# Patient Record
Sex: Female | Born: 1945 | Race: White | Hispanic: No | Marital: Married | Smoking: Never smoker
Health system: Southern US, Community
[De-identification: ages and names within clinical notes are randomized; demographics above are authoritative.]

## PROBLEM LIST (undated history)

## (undated) DIAGNOSIS — K219 Gastro-esophageal reflux disease without esophagitis: Secondary | ICD-10-CM

## (undated) DIAGNOSIS — K6389 Other specified diseases of intestine: Secondary | ICD-10-CM

## (undated) DIAGNOSIS — K635 Polyp of colon: Secondary | ICD-10-CM

## (undated) DIAGNOSIS — K5190 Ulcerative colitis, unspecified, without complications: Secondary | ICD-10-CM

## (undated) DIAGNOSIS — F32A Depression, unspecified: Secondary | ICD-10-CM

## (undated) DIAGNOSIS — D649 Anemia, unspecified: Secondary | ICD-10-CM

## (undated) DIAGNOSIS — K589 Irritable bowel syndrome without diarrhea: Secondary | ICD-10-CM

## (undated) DIAGNOSIS — M810 Age-related osteoporosis without current pathological fracture: Secondary | ICD-10-CM

## (undated) DIAGNOSIS — F419 Anxiety disorder, unspecified: Secondary | ICD-10-CM

## (undated) DIAGNOSIS — K5900 Constipation, unspecified: Secondary | ICD-10-CM

## (undated) DIAGNOSIS — R51 Headache: Secondary | ICD-10-CM

## (undated) DIAGNOSIS — F329 Major depressive disorder, single episode, unspecified: Secondary | ICD-10-CM

## (undated) DIAGNOSIS — R519 Headache, unspecified: Secondary | ICD-10-CM

## (undated) HISTORY — DX: Ulcerative colitis, unspecified, without complications: K51.90

## (undated) HISTORY — PX: OTHER SURGICAL HISTORY: SHX169

## (undated) HISTORY — DX: Other specified diseases of intestine: K63.89

## (undated) HISTORY — PX: TONSILLECTOMY: SUR1361

## (undated) HISTORY — DX: Anemia, unspecified: D64.9

## (undated) HISTORY — PX: ABDOMINAL HYSTERECTOMY: SHX81

## (undated) HISTORY — DX: Gastro-esophageal reflux disease without esophagitis: K21.9

## (undated) HISTORY — DX: Major depressive disorder, single episode, unspecified: F32.9

## (undated) HISTORY — PX: APPENDECTOMY: SHX54

## (undated) HISTORY — DX: Polyp of colon: K63.5

## (undated) HISTORY — PX: HIP ARTHROSCOPY: SUR88

## (undated) HISTORY — DX: Irritable bowel syndrome, unspecified: K58.9

## (undated) HISTORY — DX: Depression, unspecified: F32.A

## (undated) HISTORY — PX: COLONOSCOPY W/ POLYPECTOMY: SHX1380

## (undated) HISTORY — DX: Anxiety disorder, unspecified: F41.9

---

## 1985-11-09 HISTORY — PX: SACROILIAC JOINT FUSION: SHX6088

## 1999-10-22 ENCOUNTER — Encounter: Admission: RE | Admit: 1999-10-22 | Discharge: 2000-01-20 | Payer: Self-pay | Admitting: Anesthesiology

## 2000-02-05 ENCOUNTER — Encounter: Admission: RE | Admit: 2000-02-05 | Discharge: 2000-05-05 | Payer: Self-pay | Admitting: Anesthesiology

## 2000-06-15 ENCOUNTER — Encounter: Admission: RE | Admit: 2000-06-15 | Discharge: 2000-09-13 | Payer: Self-pay | Admitting: Anesthesiology

## 2000-10-25 ENCOUNTER — Encounter: Admission: RE | Admit: 2000-10-25 | Discharge: 2001-01-23 | Payer: Self-pay | Admitting: Anesthesiology

## 2001-06-13 ENCOUNTER — Encounter: Admission: RE | Admit: 2001-06-13 | Discharge: 2001-07-09 | Payer: Self-pay | Admitting: Anesthesiology

## 2004-09-23 ENCOUNTER — Encounter: Admission: RE | Admit: 2004-09-23 | Discharge: 2004-09-23 | Payer: Self-pay | Admitting: Neurology

## 2005-03-06 ENCOUNTER — Encounter
Admission: RE | Admit: 2005-03-06 | Discharge: 2005-06-04 | Payer: Self-pay | Admitting: Physical Medicine & Rehabilitation

## 2005-03-10 ENCOUNTER — Ambulatory Visit: Payer: Self-pay | Admitting: Physical Medicine & Rehabilitation

## 2005-04-22 ENCOUNTER — Ambulatory Visit: Payer: Self-pay | Admitting: Physical Medicine & Rehabilitation

## 2005-05-26 ENCOUNTER — Ambulatory Visit: Payer: Self-pay | Admitting: Physical Medicine & Rehabilitation

## 2005-06-24 ENCOUNTER — Encounter
Admission: RE | Admit: 2005-06-24 | Discharge: 2005-09-22 | Payer: Self-pay | Admitting: Physical Medicine & Rehabilitation

## 2005-07-29 ENCOUNTER — Ambulatory Visit: Payer: Self-pay | Admitting: Physical Medicine & Rehabilitation

## 2005-10-06 ENCOUNTER — Encounter: Admission: RE | Admit: 2005-10-06 | Discharge: 2005-10-06 | Payer: Self-pay | Admitting: Neurosurgery

## 2014-11-09 HISTORY — PX: CHOLECYSTECTOMY: SHX55

## 2015-02-07 ENCOUNTER — Encounter: Payer: Self-pay | Admitting: Internal Medicine

## 2015-04-19 ENCOUNTER — Ambulatory Visit (INDEPENDENT_AMBULATORY_CARE_PROVIDER_SITE_OTHER): Payer: Medicare Other | Admitting: Internal Medicine

## 2015-04-19 ENCOUNTER — Encounter: Payer: Self-pay | Admitting: Internal Medicine

## 2015-04-19 VITALS — BP 158/100 | HR 80 | Ht 60.0 in | Wt 109.0 lb

## 2015-04-19 DIAGNOSIS — R109 Unspecified abdominal pain: Secondary | ICD-10-CM

## 2015-04-19 DIAGNOSIS — K5900 Constipation, unspecified: Secondary | ICD-10-CM | POA: Diagnosis not present

## 2015-04-19 DIAGNOSIS — R197 Diarrhea, unspecified: Secondary | ICD-10-CM

## 2015-04-19 DIAGNOSIS — K219 Gastro-esophageal reflux disease without esophagitis: Secondary | ICD-10-CM | POA: Diagnosis not present

## 2015-04-19 DIAGNOSIS — R935 Abnormal findings on diagnostic imaging of other abdominal regions, including retroperitoneum: Secondary | ICD-10-CM

## 2015-04-19 MED ORDER — NA SULFATE-K SULFATE-MG SULF 17.5-3.13-1.6 GM/177ML PO SOLN: 1.0000 | Freq: Once | ORAL | Status: DC

## 2015-04-19 MED ORDER — DIPHENOXYLATE-ATROPINE 2.5-0.025 MG PO TABS: 1.0000 | ORAL_TABLET | ORAL | Status: DC | PRN

## 2015-04-19 NOTE — Patient Instructions (Signed)
You have been scheduled for a colonoscopy. Please follow written instructions given to you at your visit today.  Please pick up your prep supplies at the pharmacy within the next 1-3 days. If you use inhalers (even only as needed), please bring them with you on the day of your procedure.   

## 2015-04-19 NOTE — Progress Notes (Signed)
HISTORY OF PRESENT ILLNESS:  Sherri Olsen is a 69 y.o. female who is referred by her primary care physician Dr. Salli Olsen regarding "ulcerative colitis". Patient reports a greater than 10 year history of chronic GI complaints manifested by constipation alternating with diarrhea. She takes both laxative's and anti-diarrheal's on demand. She also has chronic lower abdominal pain 10 years. Seemingly improved after defecation. She tells me that she has had a previous gastroenterologist but has no specific diagnosis. The patient is a retired Therapist, sports. She brings no outside medical records. Her current history is that of severe lower abdominal pain associated with nausea and vomiting 01/01/2015. She tells me that she went to the local emergency room and underwent a contrast enhanced CT scan of the abdomen and pelvis and was given a diagnosis of "ulcerative colitis". She was not having unusual diarrhea or bleeding at that time. She was told to see a gastroenterologist. She did not want to see a local specialist, but rather here as she previously worked at [employer] She also has a history of chronic GERD for which she takes PPI and Zantac. Despite this reports breakthrough symptoms. No dysphagia.. She tells me that she last underwent colonoscopy 11 years ago (findings uncertain). Told to have repeat colonoscopy last year. Interested now. Also tells me she underwent upper endoscopy 3 or 4 years ago to evaluate noncardiac chest pain. She tells me that she had Helicobacter pylori but was not treated. Her weight fluctuates. She is anxious and pleeds that she wants her abdominal complaints to resolve. She is status post appendectomy and partial hysterectomy  REVIEW OF SYSTEMS:  All non-GI ROS negative except for anxiety, back pain, fatigue, night sweats, sleeping problems, urinary leakage, excessive urination, depression  Past Medical History  Diagnosis Date  . Anemia   . GERD (gastroesophageal reflux disease)    . Ulcerative colitis   . Anxiety and depression     Past Surgical History  Procedure Laterality Date  . Appendectomy    . Pip joint fusion  1987    Hip  . Herniated disk    . Hip arthroscopy      Social History Sherri Olsen  reports that she has never smoked. She has never used smokeless tobacco. She reports that she does not drink alcohol or use illicit drugs.  family history includes Colon cancer in her father; Diabetes in her maternal uncle; Diverticulitis in her mother; Irritable bowel syndrome in her maternal uncle; Ulcerative colitis in her maternal uncle.  No Known Allergies     PHYSICAL EXAMINATION: Vital signs: BP 154/100 mmHg  Pulse 80  Ht 5' (1.524 m)  Wt 109 lb (49.442 kg)  BMI 21.29 kg/m2  Constitutional: generally well-appearing, no acute distress Psychiatric: alert and oriented x3, cooperative Eyes: extraocular movements intact, anicteric, conjunctiva pink Mouth: oral pharynx moist, no lesions Neck: supple no lymphadenopathy Cardiovascular: heart regular rate and rhythm, no murmur Lungs: clear to auscultation bilaterally Abdomen: soft, nontender, nondistended, no obvious ascites, no peritoneal signs, normal bowel sounds, no organomegaly Rectal: Deferred until colonoscopy Extremities: no lower extremity edema bilaterally Skin: no lesions on visible extremities Neuro: No focal deficits. No asterixis.    ASSESSMENT:  #1. Probable irritable bowel syndrome #2. Not certain what CT scan finding was elsewhere in February. Not certain to me that she has ulcerative colitis, but needs to be excluded #3. Chronic GERD. Breakthrough symptoms despite therapy #4. Reports history of Helicobacter pylori untreated #5. Gen. medical problems  PLAN:  #1. Refill  Lomotil for diarrhea as requested #2. Schedule colonoscopy to evaluate abnormal CT scan finding, lower GI complaints, and provide follow-up neoplasia screening.The nature of the procedure, as well as  the risks, benefits, and alternatives were carefully and thoroughly reviewed with the patient. Ample time for discussion and questions allowed. The patient understood, was satisfied, and agreed to proceed. #3. Schedule upper endoscopy with duodenal biopsies to evaluate pain, diarrhea, and rule out active Helicobacter pylori infection.The nature of the procedure, as well as the risks, benefits, and alternatives were carefully and thoroughly reviewed with the patient. Ample time for discussion and questions allowed. The patient understood, was satisfied, and agreed to proceed. #4. Release sign for her to retrieve outside records. We will review these. #5. Uncomfortable at her last colonoscopy with conscious sedation. Did not have propofol. We'll use propofol this time with CRNA monitoring   Of this consultation has been sent to Dr. Salli Olsen

## 2015-04-30 ENCOUNTER — Ambulatory Visit (AMBULATORY_SURGERY_CENTER): Payer: Medicare Other | Admitting: Internal Medicine

## 2015-04-30 ENCOUNTER — Encounter: Payer: Self-pay | Admitting: Internal Medicine

## 2015-04-30 VITALS — BP 133/99 | HR 78 | Temp 98.2°F | Resp 28 | Ht 60.0 in | Wt 109.0 lb

## 2015-04-30 DIAGNOSIS — K219 Gastro-esophageal reflux disease without esophagitis: Secondary | ICD-10-CM

## 2015-04-30 DIAGNOSIS — Z1211 Encounter for screening for malignant neoplasm of colon: Secondary | ICD-10-CM | POA: Diagnosis not present

## 2015-04-30 DIAGNOSIS — R1084 Generalized abdominal pain: Secondary | ICD-10-CM

## 2015-04-30 DIAGNOSIS — K5909 Other constipation: Secondary | ICD-10-CM

## 2015-04-30 DIAGNOSIS — Z8619 Personal history of other infectious and parasitic diseases: Secondary | ICD-10-CM | POA: Diagnosis not present

## 2015-04-30 DIAGNOSIS — D122 Benign neoplasm of ascending colon: Secondary | ICD-10-CM | POA: Diagnosis not present

## 2015-04-30 DIAGNOSIS — D120 Benign neoplasm of cecum: Secondary | ICD-10-CM

## 2015-04-30 DIAGNOSIS — K5900 Constipation, unspecified: Secondary | ICD-10-CM | POA: Diagnosis not present

## 2015-04-30 MED ORDER — SODIUM CHLORIDE 0.9 % IV SOLN: 500.0000 mL | INTRAVENOUS | Status: DC

## 2015-04-30 NOTE — Op Note (Signed)
[place]  [address]. [place], [id number]   ENDOSCOPY PROCEDURE REPORT  PATIENT: Sherri, Olsen  MR#: [id number] BIRTHDATE: October 14, 1946 , 39  yrs. old GENDER: female ENDOSCOPIST: Eustace Quail, MD REFERRED BY:  Elyn Aquas, M.D. PROCEDURE DATE:  04/30/2015 PROCEDURE:  EGD w/ biopsy ASA CLASS:     Class II INDICATIONS:  dyspepsia and history of esophageal reflux. . Reports Helicobacter pylori untreated MEDICATIONS: Monitored anesthesia care and Propofol 50 mg IV TOPICAL ANESTHETIC: none  DESCRIPTION OF PROCEDURE: After the risks benefits and alternatives of the procedure were thoroughly explained, informed consent was obtained.  The LB [id number] endoscope was introduced through the mouth and advanced to the second portion of the duodenum , Without limitations.  The instrument was slowly withdrawn as the mucosa was fully examined.   EXAM: The esophagus and gastroesophageal junction were completely normal in appearance.  The stomach was entered and closely examined.The antrum, angularis, and lesser curvature were well visualized, including a retroflexed view of the cardia and fundus. The stomach wall was normally distensable.  The scope passed easily through the pylorus into the duodenum.  Retroflexed views revealed no abnormalities.Marland Kitchen CLO biopsy from the gastric antrum taken. The scope was then withdrawn from the patient and the procedure completed.  COMPLICATIONS: There were no immediate complications.  ENDOSCOPIC IMPRESSION: 1. Normal EGD 2. CLO biopsy taken  RECOMMENDATIONS: 1.  Anti-reflux regimen to be followed 2.  Continue current meds 3.  Rx CLO if positive. This test is for Helicobacter pylori. We'll call you if positive only. 4. Make a routine follow-up appointment with Dr. Henrene Pastor in 6-8 weeks   REPEAT EXAM:  eSigned:  Eustace Quail, MD 04/30/2015 2:48 PM    CC:The Patient and Elyn Aquas, MD

## 2015-04-30 NOTE — Op Note (Signed)
[place]  [address]. [place], [id number]   COLONOSCOPY PROCEDURE REPORT  PATIENT: Sherri Olsen, Sherri Olsen  MR#: [id number] BIRTHDATE: [birth year] , 52  yrs. old GENDER: female ENDOSCOPIST: Eustace Quail, MD REFERRED EL:MRAJHHI Salli Real, M.D. PROCEDURE DATE:  04/30/2015 PROCEDURE:   Colonoscopy, screening and Colonoscopy with snare polypectomy x 2 First Screening Colonoscopy - Avg.  risk and is 50 yrs.  old or older Yes.  Prior Negative Screening - Now for repeat screening. N/A  History of Adenoma - Now for follow-up colonoscopy & has been > or = to 3 yrs.  N/A  Polyps removed today? Yes ASA CLASS:   Class II INDICATIONS:Screening for colonic neoplasia and Colorectal Neoplasm Risk Assessment for this procedure is average risk. Reports colonoscopy elsewhere 11 years ago. No report available for review. No polyps per patient report. MEDICATIONS: Monitored anesthesia care and Propofol 250 mg IV DESCRIPTION OF PROCEDURE:   After the risks benefits and alternatives of the procedure were thoroughly explained, informed consent was obtained.  The digital rectal exam revealed no abnormalities of the rectum.   The LB [id number]  endoscope was introduced through the anus and advanced to the cecum, which was identified by both the appendix and ileocecal valve. No adverse events experienced.   The quality of the prep was excellent. (Suprep was used)  The instrument was then slowly withdrawn as the colon was fully examined. Estimated blood loss is zero unless otherwise noted in this procedure report.  COLON FINDINGS: A pedunculated polyp measuring 12 mm in size was found at the cecum.  A polypectomy was performed using snare cautery.   A sessile polyp measuring 7 mm in size was found in the ascending colon.  A polypectomy was performed with a cold snare. The resection for each was complete, the polyp tissue was completely retrieved and sent to histology.   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   The examination was otherwise normal. Retroflexed views revealed internal hemorrhoids. The time to cecum = 2.2 Withdrawal time = 10.8   The scope was withdrawn and the procedure completed. COMPLICATIONS: There were no immediate complications.  ENDOSCOPIC IMPRESSION: 1.   Pedunculated polyp was found at the cecum; polypectomy was performed using snare cautery 2.   Sessile polyp was found in the ascending colon; polypectomy was performed with a cold snare 3.   Melanosis coli was found throughout the entire examined colon 4.   The examination was otherwise normal 5. Chronic GI complaints consistent with irritable bowel syndrome  RECOMMENDATIONS: 1.  Repeat Colonoscopy in 3 years. 2.  Upper endoscopy today (please see report)  eSigned:  Eustace Quail, MD 04/30/2015 2:43 PM   cc: The Patient    ; Elyn Aquas, MD

## 2015-04-30 NOTE — Progress Notes (Signed)
Called to room to assist during endoscopic procedure.  Patient ID and intended procedure confirmed with present staff. Received instructions for my participation in the procedure from the performing physician.  

## 2015-04-30 NOTE — Patient Instructions (Signed)

## 2015-04-30 NOTE — Progress Notes (Signed)
Report to PACU, RN, vss, BBS= Clear.  

## 2015-05-01 ENCOUNTER — Telehealth: Payer: Self-pay

## 2015-05-01 LAB — HELICOBACTER PYLORI SCREEN-BIOPSY: UREASE: NEGATIVE

## 2015-05-01 NOTE — Telephone Encounter (Signed)
Left a message at [phone] for the pt with ID on the recording.  Pt to call back if any questions or concerns. maw

## 2015-05-06 ENCOUNTER — Encounter: Payer: Self-pay | Admitting: Internal Medicine

## 2015-05-09 ENCOUNTER — Telehealth: Payer: Self-pay | Admitting: Internal Medicine

## 2015-05-09 MED ORDER — DIPHENOXYLATE-ATROPINE 2.5-0.025 MG PO TABS: 1.0000 | ORAL_TABLET | ORAL | Status: DC | PRN

## 2015-05-10 ENCOUNTER — Other Ambulatory Visit: Payer: Self-pay | Admitting: Internal Medicine

## 2015-05-10 NOTE — Telephone Encounter (Signed)
Refilled Lomotil 

## 2015-05-14 NOTE — Telephone Encounter (Signed)
Faxed Lomotil rx

## 2015-05-27 ENCOUNTER — Other Ambulatory Visit: Payer: Self-pay | Admitting: Internal Medicine

## 2015-05-27 ENCOUNTER — Telehealth: Payer: Self-pay | Admitting: Internal Medicine

## 2015-05-28 NOTE — Telephone Encounter (Signed)
Refilled Lomotil 

## 2015-06-06 ENCOUNTER — Telehealth: Payer: Self-pay | Admitting: Internal Medicine

## 2015-06-06 MED ORDER — DIPHENOXYLATE-ATROPINE 2.5-0.025 MG PO TABS: ORAL_TABLET | ORAL | Status: DC

## 2015-06-06 NOTE — Telephone Encounter (Signed)
Faxed rx for Lomotil to [facility] (2nd time)

## 2015-06-12 ENCOUNTER — Encounter: Payer: Self-pay | Admitting: Internal Medicine

## 2015-06-12 ENCOUNTER — Ambulatory Visit (INDEPENDENT_AMBULATORY_CARE_PROVIDER_SITE_OTHER): Payer: Medicare Other | Admitting: Internal Medicine

## 2015-06-12 VITALS — BP 132/74 | HR 72 | Ht 60.0 in | Wt 114.2 lb

## 2015-06-12 DIAGNOSIS — R197 Diarrhea, unspecified: Secondary | ICD-10-CM | POA: Diagnosis not present

## 2015-06-12 DIAGNOSIS — K589 Irritable bowel syndrome without diarrhea: Secondary | ICD-10-CM

## 2015-06-12 DIAGNOSIS — K219 Gastro-esophageal reflux disease without esophagitis: Secondary | ICD-10-CM | POA: Diagnosis not present

## 2015-06-12 MED ORDER — DIPHENOXYLATE-ATROPINE 2.5-0.025 MG PO TABS: ORAL_TABLET | ORAL | Status: DC

## 2015-06-12 NOTE — Progress Notes (Signed)
HISTORY OF PRESENT ILLNESS:  Sherri Olsen is a 69 y.o. female who was initially evaluated as a new patient 04/19/2015 regarding multiple GI complaints (see dictation). She was felt to have probable irritable bowel syndrome and chronic GERD. There was a dubious diagnosis of "colitis" and reports of untreated Helicobacter pylori. For her diarrhea we've refilled Lomotil. She subsequently underwent colonoscopy and upper endoscopy on 04/30/2015. Colonoscopy revealed large adenomatous colon polyps which were removed and melanosis coli. Repeat colonoscopy in 3 years recommended. Upper endoscopy was performed and returned normal. Biopsies for Helicobacter pylori were negative. She presents for follow-up at this time. The patient reports good control of reflux symptoms on ranitidine 300 mg twice a day. Or omeprazole 40 mg daily. She continues to complain of alternating bowel habits for which she takes laxative use and anti-diarrheal's. No new complaints. She has multiple questions. We reviewed her findings in detail.  REVIEW OF SYSTEMS:  All non-GI ROS negative except for anxiety, arthritis  Past Medical History  Diagnosis Date  . Anemia   . GERD (gastroesophageal reflux disease)   . Ulcerative colitis   . Anxiety and depression     Past Surgical History  Procedure Laterality Date  . Appendectomy    . Pip joint fusion  1987    Hip  . Herniated disk    . Hip arthroscopy      Social History Sherri Olsen  reports that she has never smoked. She has never used smokeless tobacco. She reports that she does not drink alcohol or use illicit drugs.  family history includes Colon cancer in her maternal grandmother; Colon cancer (age of onset: 17) in her father; Diabetes in her maternal uncle; Diverticulitis in her mother; Irritable bowel syndrome in her maternal uncle; Ulcerative colitis in her maternal uncle.  No Known Allergies     PHYSICAL EXAMINATION: Vital signs: BP 132/74 mmHg   Pulse 72  Ht 5' (1.524 m)  Wt 114 lb 3.2 oz (51.801 kg)  BMI 22.30 kg/m2 General: Well-developed, well-nourished, no acute distress HEENT: Sclerae are anicteric, conjunctiva pink. Oral mucosa intact Lungs: Clear Heart: Regular Abdomen: soft, nontender, nondistended, no obvious ascites, no peritoneal signs, normal bowel sounds. No organomegaly. Extremities: No clubbing cyanosis or edema Psychiatric: alert and oriented x3. Cooperative   ASSESSMENT:  #1. Chronic irritable bowel syndrome. Alternating. Ongoing #2. GERD. Controlled with medical therapy #3. Adenomatous colon polyps. Due for surveillance in 3 years  PLAN:  #1. Educational discussion on irritable bowel syndrome. Recommended that she increase fiber in hopes of reducing wide swings in bowel habits with resultant less ping-pong use of anti-diarrheal's and laxative #2. Reflux precautions #3. Refill Lomotil as requested. Minimal use stressed #4. Lowest dose of acid suppressive therapy to control reflux symptoms recommended #5. Surveillance colonoscopy 3 years #6. Routine office follow-up one year  25 minutes was spent face-to-face with this patient. Greater than 50% of the time was used for counseling regarding her GI conditions and answering her multiple questions.

## 2015-06-12 NOTE — Patient Instructions (Signed)
We have sent the following medications to your pharmacy for you to pick up at your convenience:  Lomotil  Please follow up with Dr. Henrene Pastor in one year

## 2015-06-20 ENCOUNTER — Other Ambulatory Visit: Payer: Self-pay | Admitting: Internal Medicine

## 2015-07-01 ENCOUNTER — Other Ambulatory Visit: Payer: Self-pay | Admitting: Internal Medicine

## 2015-07-08 ENCOUNTER — Other Ambulatory Visit: Payer: Self-pay | Admitting: Internal Medicine

## 2015-07-09 NOTE — Telephone Encounter (Signed)
Patient requesting refill of Lomotil.  Was given #30 on 07/01/2015.  Spoke with pharmacist at [facility] in [place] who reported that patient was in a ER in [place] a couple of times and was given several narcotics.  Pharmacist states they are refilling rx's for her early on a regular basis.  Should I refill her Lomotil or wait?  Thanks!

## 2015-07-09 NOTE — Telephone Encounter (Signed)
No refill

## 2015-08-05 ENCOUNTER — Telehealth: Payer: Self-pay | Admitting: Internal Medicine

## 2015-08-07 ENCOUNTER — Other Ambulatory Visit: Payer: Self-pay | Admitting: Internal Medicine

## 2015-08-09 ENCOUNTER — Telehealth: Payer: Self-pay

## 2015-08-09 NOTE — Telephone Encounter (Signed)
Spoke with Jana Half at [facility] in [place] again and she that patient tried to transfer a Lomotil rx from [facility] (Dr. Elyn Aquas) to [facility].  That rx was filled on 07/22/2015 by that pharmacy - she was given #60.  Jana Half spoke with the pharmacy there and they have deactivated that rx but patient is definitely obtaining Lomotil from other sources.  Jana Half is going to call patient.

## 2015-08-09 NOTE — Telephone Encounter (Signed)
Spoke with Sherri Olsen at [facility] - Lomotil last filled on 07/01/2015 with no refills.  Patient requested another refill on 07/08/2015. On 07/09/2015, before I attempted to refill it, pharmacist told me patient was refilling all her medicines early.  Sent this message to Dr. Henrene Pastor who said no refill at that time.  Denied the refill request of #30 with 1 refill on 07/10/2015 with the reason that patient was requesting refill too soon.  Somehow patient was able to refill that rx with 1 refill - either the pharmacist misunderstood that I was refusing the request or someone else called this in and did not document it.  Patient received #30 of Lomotil on 07/09/2015.  She then refilled #30 on 08/03/2015 and again on 08/06/2015. She then called the pharmacist 08/07/2015 asking for more.  Pharmacist called me and I denied the request over the phone.  Pharmacist will document chain of events on her side as well.

## 2015-08-12 ENCOUNTER — Telehealth: Payer: Self-pay | Admitting: Internal Medicine

## 2015-08-12 NOTE — Telephone Encounter (Signed)
Patient called back requesting the dissolvable zofran. Patient states that she is vomiting and having diarrhea.

## 2015-08-12 NOTE — Telephone Encounter (Signed)
Not only was patient somehow to able to refill her Lomotil 2 times - #30 each on 9/24 and 9/27- since I denied her last request on 07/07/2015, she also got #60 from another doctor on 9/12.  Patient continues to call requesting refills and claiming terrible diarrhea; told to try otc options but so far continues to call with same issues;   Please advise!  Thanks!

## 2015-08-12 NOTE — Telephone Encounter (Signed)
Sorry - didn't see yet another phone message from her - requesting Zofran, which I can't find anywhere in her med list.

## 2015-08-12 NOTE — Telephone Encounter (Signed)
Magda Paganini looks like you have been handling this pt and the lomotil.

## 2015-08-12 NOTE — Telephone Encounter (Signed)
No further refills from this office

## 2015-08-12 NOTE — Telephone Encounter (Signed)
Pt called back and needs Lomotil and Zofran

## 2015-08-13 ENCOUNTER — Other Ambulatory Visit: Payer: Self-pay | Admitting: Internal Medicine

## 2015-08-15 NOTE — Telephone Encounter (Signed)
See phone note dated 08/15/2015

## 2015-08-15 NOTE — Telephone Encounter (Signed)
Reiterated to patient that Dr. Henrene Pastor was unwilling to refill any meds at this time.  Patient told me she had an appointment with him for next month and in the meantime her diarrhea had now changed into constipation.  We talked about using Miralax, dulcolax, etc for this and talked about the effect stress has on IBS.  Patient is going to talk to Dr. Henrene Pastor about different treatment options when she is here next month

## 2015-09-25 ENCOUNTER — Encounter: Payer: Self-pay | Admitting: Internal Medicine

## 2015-09-25 ENCOUNTER — Ambulatory Visit (INDEPENDENT_AMBULATORY_CARE_PROVIDER_SITE_OTHER): Payer: Medicare Other | Admitting: Internal Medicine

## 2015-09-25 VITALS — BP 140/80 | HR 92 | Ht 59.5 in | Wt 115.2 lb

## 2015-09-25 DIAGNOSIS — K591 Functional diarrhea: Secondary | ICD-10-CM

## 2015-09-25 DIAGNOSIS — K589 Irritable bowel syndrome without diarrhea: Secondary | ICD-10-CM

## 2015-09-25 DIAGNOSIS — K5900 Constipation, unspecified: Secondary | ICD-10-CM | POA: Diagnosis not present

## 2015-09-25 DIAGNOSIS — K219 Gastro-esophageal reflux disease without esophagitis: Secondary | ICD-10-CM | POA: Diagnosis not present

## 2015-09-25 DIAGNOSIS — K5909 Other constipation: Secondary | ICD-10-CM

## 2015-09-25 MED ORDER — DIPHENOXYLATE-ATROPINE 2.5-0.025 MG PO TABS: 1.0000 | ORAL_TABLET | ORAL | Status: DC | PRN

## 2015-09-25 NOTE — Progress Notes (Signed)
HISTORY OF PRESENT ILLNESS:  Sherri Olsen is a 69 y.o. female who was initially evaluated as a new patient 04/19/2015 with MULTIPLE GI complaints (see dictation). Last evaluated in the office 06/12/2015. Lasix that dictation. The clinical impression was chronic irritable bowel syndrome (alternating). GERD, controlled with medical therapy. And, adenomatous colon polyps (advanced) on recent colonoscopy with surveillance planned in 3 years. Sometime thereafter in August she tells me that she developed symptomatically gallbladder disease with choledocholithiasis for which she underwent ERCP and subsequent cholecystectomy in [place]. She had been placed on Viberzi elsewhere months earlier. She continues to complain of chronic alternating bowel habits and requests Lomotil. Patient tells me that she takes Dulcolax laxatives every evening because she wants to avoid constipation. However, when she gets diarrhea or is afraid of diarrhea prior to public appearance she takes Lomotil. She tells me that Imodium does not work. She has been doing this for years. She did have melanosis coli. She is not willing to alter her pattern of treating her bowel complaints but pleads for resolution  REVIEW OF SYSTEMS:  All non-GI ROS negative except for anxiety, allergies, fatigue, headaches, night sweats, sleeping problems, excessive urination  Past Medical History  Diagnosis Date  . Anemia   . GERD (gastroesophageal reflux disease)   . Ulcerative colitis ([place])     ?  Marland Kitchen Anxiety and depression   . IBS (irritable bowel syndrome)   . Colon polyps     adenomatous  . Melanosis coli     Past Surgical History  Procedure Laterality Date  . Appendectomy    . Pip joint fusion  1987    Hip  . Herniated disk    . Hip arthroscopy      Social History Armie Holcombe  reports that she has never smoked. She has never used smokeless tobacco. She reports that she does not drink alcohol or use illicit  drugs.  family history includes Colon cancer in her maternal grandmother; Colon cancer (age of onset: 30) in her father; Diabetes in her maternal uncle; Diverticulitis in her mother; Irritable bowel syndrome in her maternal uncle; Ulcerative colitis in her maternal uncle.  No Known Allergies     PHYSICAL EXAMINATION: Vital signs: BP 140/80 mmHg  Pulse 92  Ht 4' 11.5" (1.511 m)  Wt 115 lb 4 oz (52.277 kg)  BMI 22.90 kg/m2 General: Well-developed, well-nourished, no acute distress HEENT: Sclerae are anicteric, conjunctiva pink. Oral mucosa intact Lungs: Clear Heart: Regular Abdomen: soft, nontender, nondistended, no obvious ascites, no peritoneal signs, normal bowel sounds. No organomegaly. Extremities: No clubbing, cyanosis, or edema Psychiatric: Anxious, alert and oriented x3. Cooperative   ASSESSMENT:  #1. Chronic IBS. Alternating. Chronic laxative and antidiarrheal use complicating issues. Lack of medical insight complicating issues #2. GERD. Remains asymptomatic on omeprazole #3. Multiple advanced adenomas June 2016 on colonoscopy #4. Recent problems with symptomatic cholelithiasis and choledocholithiasis managed elsewhere   PLAN:  #1. Long discussion today on irritable bowel syndrome. Patient was encouraged to refrain from her chronic laxative and antidiarrheal pattern. Not willing.  #2. Again encouraged to use lowest dose of PPI to manage GERD #3. Agreed to provide very limited Lomotil for rescue as needed. Will allow 20 every other month. #4. Surveillance colonoscopy in June 2019 #5. Routine GI follow-up one year  25 minutes was spent face-to-face with the patient. Greater than 50% of the time use for counseling her regarding her irritable bowel syndrome

## 2015-09-25 NOTE — Patient Instructions (Signed)
We have sent the following medications to your pharmacy for you to pick up at your convenience:  Lomotil - can only be refilled every other month.

## 2015-10-22 ENCOUNTER — Telehealth: Payer: Self-pay | Admitting: Internal Medicine

## 2015-10-23 ENCOUNTER — Telehealth: Payer: Self-pay

## 2015-10-23 NOTE — Telephone Encounter (Signed)
Patient was told at office visit on 09/28/2015 that she could have 20 Lomotil every other month.  I sent Dr. Henrene Pastor a message regarding this refill which was denied for the reason mentioned.  She will not be due for more until 11/28/2015.  Left a message explaining this on patient's phone

## 2015-10-23 NOTE — Telephone Encounter (Signed)
-----   Message from Irene Shipper, MD sent at 10/23/2015 12:00 PM EST ----- Denied. Thanks for checking ----- Message -----    From: Audrea Muscat, CMA    Sent: 10/23/2015  10:55 AM      To: Irene Shipper, MD  Patient is asking for a refill of Lomotil to take out of town "just in case."  It was last filled 09/25/2015 with the instructions she could have #20 every other month, so technically she is not do.  Just wanted to check - please advise  :)

## 2015-11-20 ENCOUNTER — Telehealth: Payer: Self-pay | Admitting: Internal Medicine

## 2015-11-20 ENCOUNTER — Other Ambulatory Visit: Payer: Self-pay | Admitting: Internal Medicine

## 2015-11-20 NOTE — Telephone Encounter (Signed)
Refill for Lomotil sent.  Next refill allowed 01/18/2016 or later.

## 2015-12-17 ENCOUNTER — Other Ambulatory Visit: Payer: Self-pay | Admitting: Otolaryngology

## 2015-12-20 ENCOUNTER — Telehealth: Payer: Self-pay | Admitting: Internal Medicine

## 2015-12-25 NOTE — Telephone Encounter (Signed)
Told patient that per Dr. Henrene Pastor, she needed to wait the full 8 weeks, which is Friday.  I told her I would send it then and call her when I had.  Patient agreed.

## 2015-12-27 ENCOUNTER — Other Ambulatory Visit: Payer: Self-pay | Admitting: Internal Medicine

## 2015-12-27 ENCOUNTER — Telehealth: Payer: Self-pay

## 2015-12-27 MED ORDER — DIPHENOXYLATE-ATROPINE 2.5-0.025 MG PO TABS: ORAL_TABLET | ORAL | Status: DC

## 2015-12-27 NOTE — Telephone Encounter (Signed)
Fax for Lomotil did not go through.  Called rx in directly to pharmacist

## 2015-12-27 NOTE — Telephone Encounter (Signed)
Spoke with patient and told her I had faxed her Lomotil rx to the pharmacy.  Patient expressed concern about effectively treating the constipation side of her IBS stating Linzess had worked well in the past but had been cost prohibitive.  She wants me to check with Dr. Henrene Pastor about trying it again, since the cost may have come down, or trying Amitiza.  I told her I would check with him and let her know his suggestions.

## 2015-12-27 NOTE — Telephone Encounter (Signed)
Pt needs a refill on her Lomotil today

## 2015-12-30 ENCOUNTER — Telehealth: Payer: Self-pay

## 2015-12-30 NOTE — Telephone Encounter (Signed)
-----   Message from Irene Shipper, MD sent at 12/27/2015  2:29 PM EST ----- She is extremely difficult to manage. I do not favor this strategy. Thus, no I do not recommend this ----- Message -----    From: Audrea Muscat, CMA    Sent: 12/27/2015   1:33 PM      To: Irene Shipper, MD  In spite of patient's frequent concerns with diarrhea, she told me today on the phone that most of the time she is constipated.  She states she had success with Linzess several years ago but it was cost prohibitive.  She wants to consider the possibility of trying Amitiza or seeing if Linzess is now more affordable.  Please advise.

## 2015-12-30 NOTE — Telephone Encounter (Signed)
Per Dr. Henrene Pastor, he does not support any changes in therapy at this time, regarding patient's questions about Linzess or Amitiza.  She needs to continue doing what she has been controlling her IBS

## 2016-02-05 ENCOUNTER — Telehealth: Payer: Self-pay | Admitting: Internal Medicine

## 2016-02-06 NOTE — Telephone Encounter (Signed)
Dr Henrene Pastor can we refill the lomotil? Last office visit stated;   PLAN:  #1. Long discussion today on irritable bowel syndrome. Patient was encouraged to refrain from her chronic laxative and antidiarrheal pattern. Not willing.  #2. Again encouraged to use lowest dose of PPI to manage GERD #3. Agreed to provide very limited Lomotil for rescue as needed. Will allow 20 every other month. #4. Surveillance colonoscopy in June 2019 #5. Routine GI follow-up one year

## 2016-02-07 MED ORDER — DIPHENOXYLATE-ATROPINE 2.5-0.025 MG PO TABS: ORAL_TABLET | ORAL | Status: DC

## 2016-02-07 NOTE — Telephone Encounter (Signed)
No more than per my recommendation previously

## 2016-02-07 NOTE — Telephone Encounter (Signed)
Prescription printed and faxed to the pharmacy 

## 2016-02-11 ENCOUNTER — Telehealth: Payer: Self-pay | Admitting: Internal Medicine

## 2016-02-11 NOTE — Telephone Encounter (Signed)
Explained to pt that Dr. Henrene Pastor will only let her have Lomotil #20 every other month and script cannot be filled until 02/24/16. Pt aware.

## 2016-02-21 ENCOUNTER — Other Ambulatory Visit: Payer: Self-pay | Admitting: Internal Medicine

## 2016-03-23 ENCOUNTER — Encounter: Payer: Self-pay | Admitting: Internal Medicine

## 2016-03-23 ENCOUNTER — Ambulatory Visit (INDEPENDENT_AMBULATORY_CARE_PROVIDER_SITE_OTHER): Payer: Medicare Other | Admitting: Internal Medicine

## 2016-03-23 VITALS — BP 148/92 | HR 88 | Ht 59.5 in | Wt 120.0 lb

## 2016-03-23 DIAGNOSIS — K5900 Constipation, unspecified: Secondary | ICD-10-CM

## 2016-03-23 DIAGNOSIS — K589 Irritable bowel syndrome without diarrhea: Secondary | ICD-10-CM

## 2016-03-23 DIAGNOSIS — K219 Gastro-esophageal reflux disease without esophagitis: Secondary | ICD-10-CM

## 2016-03-23 DIAGNOSIS — K5909 Other constipation: Secondary | ICD-10-CM

## 2016-03-23 MED ORDER — LINACLOTIDE 145 MCG PO CAPS
145.0000 ug | ORAL_CAPSULE | Freq: Every day | ORAL | Status: DC
Start: 2016-03-23 — End: 2016-08-20

## 2016-03-23 NOTE — Progress Notes (Signed)
HISTORY OF PRESENT ILLNESS:  Sherri Olsen is a 70 y.o. female with long-standing IBS and GERD. Initially evaluated June 2016 with multiple GI complaints. See that dictation. Last evaluated November 2016. Upper endoscopy in June 2016 was normal. 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 Follow-up in 3 years recommended. She presents today for "annual follow-up". She continues with chronic constipation that she medicates with over-the-counter laxatives. Will subsequently developed diarrhea and take Lomotil. She continues to struggle with these issues. We have discussed this previously. She continues on PPI for GERD. For breakthrough she takes ranitidine. No dysphagia. She requests linzess. She tells me that she took this previously and was very helpful, but costly. She states that she tolerated this without side effects. Also complains of 18 pound weight gain over the past several years. She has been less active but eating about same  REVIEW OF SYSTEMS:  All non-GI ROS negative upon review except for anxiety  Past Medical History  Diagnosis Date  . Anemia   . GERD (gastroesophageal reflux disease)   . Ulcerative colitis ([place])     ?  Marland Kitchen Anxiety and depression   . IBS (irritable bowel syndrome)   . Colon polyps     adenomatous  . Melanosis coli     Past Surgical History  Procedure Laterality Date  . Appendectomy    . Pip joint fusion  1987    Hip  . Herniated disk    . Hip arthroscopy    . Cholecystectomy  2016    Social History Sherri Olsen  reports that she has never smoked. She has never used smokeless tobacco. She reports that she does not drink alcohol or use illicit drugs.  family history includes Colon cancer in her maternal grandmother; Colon cancer (age of onset: 82) in her father; Diabetes in her maternal uncle; Diverticulitis in her mother; Irritable bowel syndrome in her maternal uncle; Ulcerative colitis in her maternal uncle.  No  Known Allergies     PHYSICAL EXAMINATION: Vital signs: BP 148/92 mmHg  Pulse 88  Ht 4' 11.5" (1.511 m)  Wt 120 lb (54.432 kg)  BMI 23.84 kg/m2  Constitutional: generally well-appearing, no acute distress Psychiatric: alert and oriented x3, cooperative Eyes: extraocular movements intact, anicteric, conjunctiva pink Mouth: oral pharynx moist, no lesions Neck: supple no lymphadenopathy Cardiovascular: heart regular rate and rhythm, no murmur Lungs: clear to auscultation bilaterally Abdomen: soft, nontender, nondistended, no obvious ascites, no peritoneal signs, normal bowel sounds, no organomegaly Rectal:Omitted Extremities: no clubbing cyanosis or lower extremity edema bilaterally Skin: no lesions on visible extremities Neuro: No focal deficits.   ASSESSMENT:  #1. Constipation predominant IBS #2. GERD #3. Advanced adenomas on colonoscopy 2016 #4. Weight gain  PLAN:  #1. Trial of linzess 145 g daily #2. Reflux precautions #3. Daily fiber supplement recommended. In addition exercise #4. Continue PPI for GERD. Lowest dose to control symptoms recommended #5. Routine office follow-up one year #6. Surveillance colonoscopy 2 years  25 minutes spent face-to-face with the patient. The majority of the time use for counseling regarding her IBS and GERD

## 2016-03-23 NOTE — Patient Instructions (Addendum)
We have sent the following medications to your pharmacy for you to pick up at your convenience:  Linzess  Please follow up in one year

## 2016-05-08 ENCOUNTER — Telehealth: Payer: Self-pay | Admitting: Internal Medicine

## 2016-05-08 MED ORDER — DIPHENOXYLATE-ATROPINE 2.5-0.025 MG PO TABS: ORAL_TABLET | ORAL | Status: DC

## 2016-05-08 NOTE — Telephone Encounter (Signed)
Spoke with pt and she is aware. Pt does not wish to pursue [facility] referral, states she really likes Dr. Henrene Pastor.

## 2016-05-08 NOTE — Telephone Encounter (Signed)
She does her own thing when it comes to her bowels, unfortunately. She takes laxatives and antidiarrheals chronically. Cyclical issues. We have discussed this. We also discussed parameters for limiting Lomotil. Check with Magda Paganini. Her PCP could refer her to tertiary GI [facility]. They have motility experts may be able to help her more that I have been able to help her.

## 2016-05-08 NOTE — Telephone Encounter (Signed)
Refill for lomotil called to pharmacy. Attempted to call pt but received message that voicemail is full. Will try again.

## 2016-05-08 NOTE — Telephone Encounter (Signed)
Pt states she is having problems with diarrhea again. Reports she usually has bad constipation and has to take laxatives to have a BM and then has diarrhea. Reports she is having accidents now due to "leaking stool" from the diarrhea. Pt would like prescription for lomotil sent in, states imodium is not working.  Pt also states she just cannot continue to live this way...........the patient would like to have a colostomy. Dr. Henrene Pastor please advise.

## 2016-06-01 ENCOUNTER — Telehealth: Payer: Self-pay | Admitting: Internal Medicine

## 2016-06-01 ENCOUNTER — Telehealth: Payer: Self-pay

## 2016-06-01 NOTE — Telephone Encounter (Signed)
Called patient back but voice mailbox is full and can't accept new messages

## 2016-06-04 ENCOUNTER — Telehealth: Payer: Self-pay

## 2016-06-04 NOTE — Telephone Encounter (Signed)
Spoke with patient and relayed Dr. Blanch Media responses to her questions and concerns.  Per Dr. Henrene Pastor she can wait to have next colonoscopy.  He also reiterated that he thinks by transferring care to [facility] she would have access to experts in GI to help her manage her bowel issues.  Patient acknowledged and understood.  She is pretty sure she would like to transfer to [facility] so I told her I would get with Vaughan Basta next week to initiate this.  Patient agreed.

## 2016-06-04 NOTE — Telephone Encounter (Signed)
See other phone note dated 06/04/2016

## 2016-06-04 NOTE — Telephone Encounter (Signed)
-----   Message from Irene Shipper, MD sent at 06/02/2016  9:41 AM EDT ----- Recommend referring her to [place] for expert opinion and management of systems regarding her bowel issues ----- Message ----- From: Audrea Muscat, CMA Sent: 06/01/2016   3:47 PM To: Irene Shipper, MD  Patient called stating that she was terribly constipated over the weekend and that after taking laxatives, she now has diarrhea that she sometimes can't control and runs down her leg.  She wanted to know about seeing a "colorectal specialist".  I reminded her that you had recommended [facility] in June when she spoke to [place].  The reason she is hesitant is because she is afraid that will cause her to lose you as a doctor and wants to know if you would continue to see her if she was being treated at [facility].    She continues to say "she can't live like this any longer".  Surprisingly she did not ask for Lomotil, which she cannot have until 06/07/2016.  Please advise.

## 2016-06-10 NOTE — Telephone Encounter (Signed)
Patient is calling back regarding this. Best # [phone]

## 2016-06-15 ENCOUNTER — Telehealth: Payer: Self-pay | Admitting: Internal Medicine

## 2016-06-15 NOTE — Telephone Encounter (Signed)
Pt states she is having a lot of diarrhea. Only has 1 lomotil left. Discussed with pt that she cannot get that filled until the end of August and that Dr. Henrene Pastor is not here all week. Pt will continue with Imodium, states she may go to the ER. Referral information faxed to [facility] motility clinic for appt.

## 2016-07-01 ENCOUNTER — Telehealth: Payer: Self-pay | Admitting: Internal Medicine

## 2016-07-01 ENCOUNTER — Encounter (HOSPITAL_COMMUNITY): Payer: Self-pay

## 2016-07-01 ENCOUNTER — Emergency Department (HOSPITAL_COMMUNITY)
Admission: EM | Admit: 2016-07-01 | Discharge: 2016-07-01 | Disposition: A | Discharge status: Home / Self Care | Payer: Medicare Other | Attending: Emergency Medicine | Admitting: Emergency Medicine

## 2016-07-01 DIAGNOSIS — M545 Low back pain: Secondary | ICD-10-CM | POA: Diagnosis present

## 2016-07-01 DIAGNOSIS — M5441 Lumbago with sciatica, right side: Secondary | ICD-10-CM | POA: Diagnosis not present

## 2016-07-01 MED ORDER — OXYCODONE-ACETAMINOPHEN 5-325 MG PO TABS
1.0000 | ORAL_TABLET | ORAL | Status: DC | PRN
  Administered 2016-07-01: 1 via ORAL

## 2016-07-01 MED ORDER — OXYCODONE-ACETAMINOPHEN 5-325 MG PO TABS
ORAL_TABLET | ORAL | Status: AC
  Filled 2016-07-01: qty 1

## 2016-07-01 MED ORDER — OXYCODONE-ACETAMINOPHEN 5-325 MG PO TABS
1.0000 | ORAL_TABLET | Freq: Once | ORAL | Status: AC
  Administered 2016-07-01: 1 via ORAL
  Filled 2016-07-01: qty 1

## 2016-07-01 NOTE — ED Provider Notes (Signed)
[facility] DEPT Provider Note   CSN: [id number] Arrival date & time: 07/01/16  1716     History   Chief Complaint Chief Complaint  Patient presents with  . Back Injury    HPI Sherri Olsen is a 70 y.o. female.  Patient presents today with a chief complaint of lower back pain.  She states that she fell eleven days ago.  She was walking down the hall backwards when she tripped over a box and landed backwards unto her back.  She states that she has been having constant severe pain since that time.  She reports intermittent tingling of the right leg.  She denies fever, chills, bowel/bladder incontinence, saddle anaesthesia, or lower extremity weakness.  She states that she was seen in the ED at [facility] three days ago and had a CT scan showing a compression fracture of the L1.  She reports that she has been taking Tylenol at home for pain, but does not feel that it is helping.        Past Medical History:  Diagnosis Date  . Anemia   . Anxiety and depression   . Colon polyps    adenomatous  . GERD (gastroesophageal reflux disease)   . IBS (irritable bowel syndrome)   . Melanosis coli   . Ulcerative colitis ([place])    ?    There are no active problems to display for this patient.   Past Surgical History:  Procedure Laterality Date  . APPENDECTOMY    . CHOLECYSTECTOMY  2016  . herniated disk    . HIP ARTHROSCOPY    . PIP JOINT FUSION  1987   Hip    OB History    No data available       Home Medications    Prior to Admission medications   Medication Sig Start Date End Date Taking? Authorizing Provider  clonazePAM (KLONOPIN) 1 MG tablet Take 1 mg by mouth 2 (two) times daily.    Historical Provider, MD  diphenoxylate-atropine (LOMOTIL) 2.5-0.025 MG tablet TAKE ONE TABLET BY MOUTH AS NEEDED FOR DIARRHEA OR  LOOSE  STOOLS  -  THIS  RX  CAN  BE  FILLED  EVERY  OTHER  MONTH 05/08/16   Irene Shipper, MD  linaclotide [facility]) 145 MCG CAPS capsule Take  1 capsule (145 mcg total) by mouth daily before breakfast. 03/23/16   Irene Shipper, MD  loperamide (IMODIUM) 2 MG capsule Take 2 mg by mouth as needed for diarrhea or loose stools.    Historical Provider, MD  omeprazole (PRILOSEC) 40 MG capsule Take 40 mg by mouth 2 (two) times daily.  02/05/10   Historical Provider, MD  ranitidine (ZANTAC) 150 MG tablet Take 150 mg by mouth as needed.     Historical Provider, MD  zolpidem (AMBIEN) 10 MG tablet Take 10 mg by mouth at bedtime as needed for sleep.    Historical Provider, MD    Family History Family History  Problem Relation Age of Onset  . Colon cancer Father 31  . Colon cancer Maternal Grandmother   . Ulcerative colitis Maternal Uncle   . Diabetes Maternal Uncle   . Irritable bowel syndrome Maternal Uncle   . Diverticulitis Mother     Social History Social History  Substance Use Topics  . Smoking status: Never Smoker  . Smokeless tobacco: Never Used  . Alcohol use No     Allergies   Review of patient's allergies indicates no known allergies.  Review of Systems Review of Systems  All other systems reviewed and are negative.    Physical Exam Updated Vital Signs BP 125/67   Pulse 72   Temp 97.8 F (36.6 C)   Resp 20   Ht 5' (1.524 m)   Wt 52.2 kg   SpO2 100%   BMI 22.46 kg/m   Physical Exam  Constitutional: She appears well-developed and well-nourished.  HENT:  Head: Normocephalic and atraumatic.  Cardiovascular: Normal rate and regular rhythm.   Pulses:      Dorsalis pedis pulses are 2+ on the right side, and 2+ on the left side.  Pulmonary/Chest: Effort normal and breath sounds normal.  Musculoskeletal:       Thoracic back: She exhibits normal range of motion, no tenderness, no bony tenderness, no swelling, no edema and no deformity.       Lumbar back: She exhibits tenderness and bony tenderness. She exhibits normal range of motion, no swelling, no edema and no deformity.  Neurological: She is alert. She has  normal strength. No sensory deficit.  Reflex Scores:      Patellar reflexes are 2+ on the right side and 2+ on the left side. Muscle strength 5/5 of LE bilaterally Distal sensation of both feet intact  Skin: Skin is warm and dry.  Psychiatric: She has a normal mood and affect.  Nursing note and vitals reviewed.    ED Treatments / Results  Labs (all labs ordered are listed, but only abnormal results are displayed) Labs Reviewed - No data to display  EKG  EKG Interpretation None       Radiology No results found.  Procedures Procedures (including critical care time)  Medications Ordered in ED Medications  oxyCODONE-acetaminophen (PERCOCET/ROXICET) 5-325 MG per tablet 1 tablet (1 tablet Oral Given 07/01/16 1729)     Initial Impression / Assessment and Plan / ED Course  I have reviewed the triage vital signs and the nursing notes.  Pertinent labs & imaging results that were available during my care of the patient were reviewed by me and considered in my medical decision making (see chart for details).  Clinical Course      Final Clinical Impressions(s) / ED Diagnoses   Final diagnoses:  None   Patient presents today with lower back pain.  She states that she had a mechanical fall eleven days ago.  Three days ago she reports that she was seen in the ED at [facility] in [place] and was diagnosed with a compression fracture of L1 via CT.  She states that she does not have any pain medication at home and was only given 10 Percocet at [facility].  Review of the narcotic database shows that she had 20 Percocet filled on 8/20.  Review of outside records shows a note from Pain Management from 06/11/16.  In this note it states that she was given a Rx for 90 Roxicodone 15 mg tablets at this visit.  She also was discharged from the practice because her UDS was positive for Morphine although she was not prescribed Morphine and was negative for Roxicodone, which she had been  prescribed by pain management.  NO signs of cord compression on exam today.  No neurological deficits and normal neuro exam.  Patient can walk but states is painful.  No loss of bowel or bladder control.  No concern for cauda equina.  No fever, night sweats, weight loss, h/o cancer, IVDU.  Patient not discharged with any pain medication due to the  above reasons.  Stable for discharge.  Return precautions given.     New Prescriptions New Prescriptions   No medications on file     Hyman Bible, PA-C 07/02/16 [place], MD 07/06/16 2201

## 2016-07-01 NOTE — ED Triage Notes (Signed)
Per PT, Pt fell backwards a week ago and had an episode of loss of feeling for ten minutes. Pt was seen in [place] and was diagnosed with compression fracture. Pt has continued to have some numbness and tingling in her legs with worsening pain. Reports walking with a walker since the accident.

## 2016-07-01 NOTE — ED Notes (Signed)
Pt brought to room D33 via wheelchair; pt placed in bed and given hospital gown

## 2016-07-01 NOTE — Telephone Encounter (Signed)
Pt states she is having bad diarrhea and has had 3 accidents this morning. Pt states the imodium is not helping but lomotil works. Discussed with pt that she should not take th laxatives she has been taking if she has diarrhea. Pt states she doesn't when she has diarrhea. Pt instructed to take imodium and adjust her diet as needed for diarrhea and to wear protective undergarments. Reminded pt that she is not able to refill her Lomotil until the end of the month. Pt is scheduled to see Dr. Loni Muse at [facility] 10/20/16 for second opinion.

## 2016-07-02 NOTE — ED Notes (Signed)
Pt reporting "that doctor was rude! I am going to complain on him" and "this was a wasted trip and wasted money"

## 2016-07-20 ENCOUNTER — Telehealth: Payer: Self-pay | Admitting: Internal Medicine

## 2016-07-21 MED ORDER — DIPHENOXYLATE-ATROPINE 2.5-0.025 MG PO TABS: ORAL_TABLET | ORAL | 0 refills | Status: DC

## 2016-07-21 NOTE — Telephone Encounter (Signed)
Lomotil faxed to [facility]

## 2016-07-21 NOTE — Telephone Encounter (Signed)
Pt is calling back about her meds.  She is completely out

## 2016-07-21 NOTE — Telephone Encounter (Signed)
Patient calling back regarding this. Patient states that she is out. Best # [phone]

## 2016-08-20 ENCOUNTER — Other Ambulatory Visit: Payer: Self-pay | Admitting: Neurosurgery

## 2016-08-25 ENCOUNTER — Encounter (HOSPITAL_COMMUNITY)
Admission: RE | Admit: 2016-08-25 | Discharge: 2016-08-25 | Disposition: A | Discharge status: Home / Self Care | Payer: Medicare Other | Source: Ambulatory Visit | Attending: Neurosurgery | Admitting: Neurosurgery

## 2016-08-25 ENCOUNTER — Encounter (HOSPITAL_COMMUNITY): Payer: Self-pay

## 2016-08-25 DIAGNOSIS — M810 Age-related osteoporosis without current pathological fracture: Secondary | ICD-10-CM | POA: Diagnosis not present

## 2016-08-25 DIAGNOSIS — Z8711 Personal history of peptic ulcer disease: Secondary | ICD-10-CM | POA: Diagnosis not present

## 2016-08-25 DIAGNOSIS — K589 Irritable bowel syndrome without diarrhea: Secondary | ICD-10-CM | POA: Diagnosis not present

## 2016-08-25 DIAGNOSIS — F329 Major depressive disorder, single episode, unspecified: Secondary | ICD-10-CM | POA: Diagnosis not present

## 2016-08-25 DIAGNOSIS — Z90710 Acquired absence of both cervix and uterus: Secondary | ICD-10-CM | POA: Diagnosis not present

## 2016-08-25 DIAGNOSIS — S32010A Wedge compression fracture of first lumbar vertebra, initial encounter for closed fracture: Secondary | ICD-10-CM | POA: Diagnosis not present

## 2016-08-25 DIAGNOSIS — Z86010 Personal history of colonic polyps: Secondary | ICD-10-CM | POA: Diagnosis not present

## 2016-08-25 DIAGNOSIS — F419 Anxiety disorder, unspecified: Secondary | ICD-10-CM | POA: Diagnosis not present

## 2016-08-25 DIAGNOSIS — X58XXXA Exposure to other specified factors, initial encounter: Secondary | ICD-10-CM | POA: Diagnosis not present

## 2016-08-25 DIAGNOSIS — G8929 Other chronic pain: Secondary | ICD-10-CM | POA: Diagnosis not present

## 2016-08-25 DIAGNOSIS — K219 Gastro-esophageal reflux disease without esophagitis: Secondary | ICD-10-CM | POA: Diagnosis not present

## 2016-08-25 HISTORY — DX: Headache: R51

## 2016-08-25 HISTORY — DX: Headache, unspecified: R51.9

## 2016-08-25 HISTORY — DX: Constipation, unspecified: K59.00

## 2016-08-25 HISTORY — DX: Anxiety disorder, unspecified: F41.9

## 2016-08-25 HISTORY — DX: Age-related osteoporosis without current pathological fracture: M81.0

## 2016-08-25 LAB — CBC
HEMATOCRIT: 38.1 % (ref 36.0–46.0)
Hemoglobin: 12.3 g/dL (ref 12.0–15.0)
MCH: 28.8 pg (ref 26.0–34.0)
MCHC: 32.3 g/dL (ref 30.0–36.0)
MCV: 89.2 fL (ref 78.0–100.0)
PLATELETS: 322 10*3/uL (ref 150–400)
RBC: 4.27 MIL/uL (ref 3.87–5.11)
RDW: 15.6 % — AB (ref 11.5–15.5)
WBC: 6.3 10*3/uL (ref 4.0–10.5)

## 2016-08-25 LAB — SURGICAL PCR SCREEN
MRSA, PCR: NEGATIVE
Staphylococcus aureus: NEGATIVE

## 2016-08-25 NOTE — Discharge Instructions (Signed)
Wound Care Keep incision covered and dry for 3 days    You may remove outer bandage after 3 days. Do not put any creams, lotions, or ointments on incision. Leave steri-strips on back  They will fall off by themselves. Activity Walk each and every day, increasing distance each day. No lifting greater than 5 lbs.  Avoid excessive back motion. No driving for 2 weeks; may ride as a passenger locally.  Diet Resume your normal soft diet.  Return to Work Will be discussed at you follow up appointment. Call Your Doctor If Any of These Occur Redness, drainage, or swelling at the wound.  Temperature greater than 101 degrees. Severe pain not relieved by pain medication.  Incision starts to come apart. Follow Up Appt Call today for appointment in 1-2 weeks [id number]) or for problems.  If you have any hardware placed in your spine, you will need an x-ray before your appointment.

## 2016-08-25 NOTE — Anesthesia Preprocedure Evaluation (Addendum)
Anesthesia Evaluation  Patient identified by MRN, date of birth, ID band Patient awake    Reviewed: Allergy & Precautions, H&P , NPO status , Patient's Chart, lab work & pertinent test results  Airway Mallampati: II  TM Distance: >3 FB Neck ROM: Full    Dental no notable dental hx. (+) Teeth Intact, Dental Advisory Given   Pulmonary neg pulmonary ROS,    Pulmonary exam normal breath sounds clear to auscultation       Cardiovascular negative cardio ROS   Rhythm:Regular Rate:Normal     Neuro/Psych  Headaches, Anxiety negative psych ROS   GI/Hepatic Neg liver ROS, PUD, GERD  Medicated and Controlled,  Endo/Other  negative endocrine ROS  Renal/GU negative Renal ROS  negative genitourinary   Musculoskeletal   Abdominal   Peds  Hematology negative hematology ROS (+)   Anesthesia Other Findings   Reproductive/Obstetrics negative OB ROS                            Anesthesia Physical Anesthesia Plan  ASA: II  Anesthesia Plan: General   Post-op Pain Management:    Induction: Intravenous  Airway Management Planned: Oral ETT  Additional Equipment:   Intra-op Plan:   Post-operative Plan: Extubation in OR  Informed Consent: I have reviewed the patients History and Physical, chart, labs and discussed the procedure including the risks, benefits and alternatives for the proposed anesthesia with the patient or authorized representative who has indicated his/her understanding and acceptance.   Dental advisory given  Plan Discussed with: CRNA  Anesthesia Plan Comments:         Anesthesia Quick Evaluation

## 2016-08-25 NOTE — Pre-Procedure Instructions (Signed)
    JEMIAH MARZULLO  08/25/2016      [place], [place] [address] Phone: [phone] Fax: [phone]    Your procedure is scheduled on Wednesday. October 18.  Report to [facility] Admitting at 6:30 AM                Your surgery or procedure is scheduled for 8:30 AM   Call this number if you have problems the morning of surgery:[phone]   Remember:  Do not eat food or drink liquids after midnight.  Take these medicines the morning of surgery with A SIP OF WATER:clonazePAM (KLONOPIN), omeprazole (PRILOSEC).                Take if needed: HYDROcodone-acetaminophen (NORCO/VICODIN), ranitidine (ZANTAC).   Do not wear jewelry, make-up or nail polish.  Do not wear lotions, powders, or perfumes, or deodorant.  Do not shave 48 hours prior to surgery.  .  Do not bring valuables to the hospital.  [facility] is not responsible for any belongings or valuables.  Contacts, dentures or bridgework may not be worn into surgery.  Leave your suitcase in the car.  After surgery it may be brought to your room.  For patients admitted to the hospital, discharge time will be determined by your treatment team.  Patients discharged the day of surgery will not be allowed to drive home.   Special instructions:  Review  [facility] - Preparing For Surgery.  Please read over the following fact sheets that you were given. - Preparing For Surgery and Patient Instructions for Mupirocin Application, Incentive Spirometry, Pain Booklet

## 2016-08-26 ENCOUNTER — Encounter (HOSPITAL_COMMUNITY): Payer: Self-pay | Admitting: Urology

## 2016-08-26 ENCOUNTER — Ambulatory Visit (HOSPITAL_COMMUNITY): Payer: Medicare Other | Admitting: Anesthesiology

## 2016-08-26 ENCOUNTER — Ambulatory Visit (HOSPITAL_COMMUNITY): Payer: Medicare Other | Admitting: Vascular Surgery

## 2016-08-26 ENCOUNTER — Observation Stay (HOSPITAL_COMMUNITY): Payer: Medicare Other

## 2016-08-26 ENCOUNTER — Encounter (HOSPITAL_COMMUNITY)
Admission: RE | Disposition: A | Discharge status: Home / Self Care | Payer: Self-pay | Source: Ambulatory Visit | Attending: Neurosurgery

## 2016-08-26 ENCOUNTER — Observation Stay (HOSPITAL_COMMUNITY)
Admission: RE | Admit: 2016-08-26 | Discharge: 2016-08-26 | Disposition: A | Discharge status: Home / Self Care | Payer: Medicare Other | Source: Ambulatory Visit | Attending: Neurosurgery | Admitting: Neurosurgery

## 2016-08-26 DIAGNOSIS — F329 Major depressive disorder, single episode, unspecified: Secondary | ICD-10-CM | POA: Diagnosis not present

## 2016-08-26 DIAGNOSIS — M810 Age-related osteoporosis without current pathological fracture: Secondary | ICD-10-CM | POA: Insufficient documentation

## 2016-08-26 DIAGNOSIS — Z419 Encounter for procedure for purposes other than remedying health state, unspecified: Secondary | ICD-10-CM

## 2016-08-26 DIAGNOSIS — F419 Anxiety disorder, unspecified: Secondary | ICD-10-CM | POA: Diagnosis not present

## 2016-08-26 DIAGNOSIS — K219 Gastro-esophageal reflux disease without esophagitis: Secondary | ICD-10-CM | POA: Insufficient documentation

## 2016-08-26 DIAGNOSIS — Z86010 Personal history of colonic polyps: Secondary | ICD-10-CM | POA: Insufficient documentation

## 2016-08-26 DIAGNOSIS — Z90710 Acquired absence of both cervix and uterus: Secondary | ICD-10-CM | POA: Insufficient documentation

## 2016-08-26 DIAGNOSIS — S32000G Wedge compression fracture of unspecified lumbar vertebra, subsequent encounter for fracture with delayed healing: Secondary | ICD-10-CM

## 2016-08-26 DIAGNOSIS — G8929 Other chronic pain: Secondary | ICD-10-CM | POA: Insufficient documentation

## 2016-08-26 DIAGNOSIS — X58XXXA Exposure to other specified factors, initial encounter: Secondary | ICD-10-CM | POA: Insufficient documentation

## 2016-08-26 DIAGNOSIS — K589 Irritable bowel syndrome without diarrhea: Secondary | ICD-10-CM | POA: Insufficient documentation

## 2016-08-26 DIAGNOSIS — S32010A Wedge compression fracture of first lumbar vertebra, initial encounter for closed fracture: Secondary | ICD-10-CM | POA: Diagnosis not present

## 2016-08-26 DIAGNOSIS — Z8711 Personal history of peptic ulcer disease: Secondary | ICD-10-CM | POA: Insufficient documentation

## 2016-08-26 HISTORY — PX: KYPHOPLASTY: SHX5884

## 2016-08-26 SURGERY — KYPHOPLASTY
Anesthesia: General | Site: Spine Lumbar

## 2016-08-26 MED ORDER — SUGAMMADEX SODIUM 200 MG/2ML IV SOLN
INTRAVENOUS | Status: AC
  Filled 2016-08-26: qty 2

## 2016-08-26 MED ORDER — ONDANSETRON HCL 4 MG/2ML IJ SOLN
INTRAMUSCULAR | Status: DC | PRN
  Administered 2016-08-26: 4 mg via INTRAVENOUS

## 2016-08-26 MED ORDER — CHLORHEXIDINE GLUCONATE CLOTH 2 % EX PADS: 6.0000 | MEDICATED_PAD | Freq: Once | CUTANEOUS | Status: DC

## 2016-08-26 MED ORDER — EPHEDRINE 5 MG/ML INJ
INTRAVENOUS | Status: AC
  Filled 2016-08-26: qty 10

## 2016-08-26 MED ORDER — DIAZEPAM 5 MG PO TABS
5.0000 mg | ORAL_TABLET | Freq: Four times a day (QID) | ORAL | Status: DC | PRN
  Administered 2016-08-26: 5 mg via ORAL

## 2016-08-26 MED ORDER — BACITRACIN ZINC 500 UNIT/GM EX OINT
TOPICAL_OINTMENT | CUTANEOUS | Status: AC
  Filled 2016-08-26: qty 28.35

## 2016-08-26 MED ORDER — DOCUSATE SODIUM 100 MG PO CAPS: 100.0000 mg | ORAL_CAPSULE | Freq: Two times a day (BID) | ORAL | Status: DC

## 2016-08-26 MED ORDER — 0.9 % SODIUM CHLORIDE (POUR BTL) OPTIME
TOPICAL | Status: DC | PRN
  Administered 2016-08-26: 1000 mL

## 2016-08-26 MED ORDER — OXYCODONE-ACETAMINOPHEN 5-325 MG PO TABS
1.0000 | ORAL_TABLET | ORAL | Status: DC | PRN
  Administered 2016-08-26: 2 via ORAL
  Filled 2016-08-26: qty 2

## 2016-08-26 MED ORDER — EPHEDRINE SULFATE 50 MG/ML IJ SOLN
INTRAMUSCULAR | Status: DC | PRN
  Administered 2016-08-26: 15 mg via INTRAVENOUS
  Administered 2016-08-26: 10 mg via INTRAVENOUS

## 2016-08-26 MED ORDER — LIDOCAINE 2% (20 MG/ML) 5 ML SYRINGE
INTRAMUSCULAR | Status: AC
  Filled 2016-08-26: qty 5

## 2016-08-26 MED ORDER — MIDAZOLAM HCL 2 MG/2ML IJ SOLN
INTRAMUSCULAR | Status: AC
  Filled 2016-08-26: qty 2

## 2016-08-26 MED ORDER — ACETAMINOPHEN 650 MG RE SUPP: 650.0000 mg | RECTAL | Status: DC | PRN

## 2016-08-26 MED ORDER — BACITRACIN ZINC 500 UNIT/GM EX OINT
TOPICAL_OINTMENT | CUTANEOUS | Status: DC | PRN
  Administered 2016-08-26: 1 via TOPICAL

## 2016-08-26 MED ORDER — ONDANSETRON HCL 4 MG/2ML IJ SOLN: 4.0000 mg | INTRAMUSCULAR | Status: DC | PRN

## 2016-08-26 MED ORDER — OXYCODONE-ACETAMINOPHEN 5-325 MG PO TABS: 1.0000 | ORAL_TABLET | ORAL | 0 refills | Status: DC | PRN

## 2016-08-26 MED ORDER — HYDROCODONE-ACETAMINOPHEN 5-325 MG PO TABS
ORAL_TABLET | ORAL | Status: AC
  Filled 2016-08-26: qty 2

## 2016-08-26 MED ORDER — PROPOFOL 10 MG/ML IV BOLUS
INTRAVENOUS | Status: AC
  Filled 2016-08-26: qty 40

## 2016-08-26 MED ORDER — IOPAMIDOL (ISOVUE-300) INJECTION 61%
INTRAVENOUS | Status: AC
  Filled 2016-08-26: qty 50

## 2016-08-26 MED ORDER — ONDANSETRON HCL 4 MG/2ML IJ SOLN
INTRAMUSCULAR | Status: AC
  Filled 2016-08-26: qty 2

## 2016-08-26 MED ORDER — LIDOCAINE HCL (CARDIAC) 20 MG/ML IV SOLN
INTRAVENOUS | Status: DC | PRN
  Administered 2016-08-26: 60 mg via INTRAVENOUS

## 2016-08-26 MED ORDER — FENTANYL CITRATE (PF) 100 MCG/2ML IJ SOLN
INTRAMUSCULAR | Status: AC
  Filled 2016-08-26: qty 4

## 2016-08-26 MED ORDER — PHENOL 1.4 % MT LIQD: 1.0000 | OROMUCOSAL | Status: DC | PRN

## 2016-08-26 MED ORDER — ACETAMINOPHEN 325 MG PO TABS: 650.0000 mg | ORAL_TABLET | ORAL | Status: DC | PRN

## 2016-08-26 MED ORDER — HYDROCODONE-ACETAMINOPHEN 5-325 MG PO TABS
1.0000 | ORAL_TABLET | ORAL | Status: DC | PRN
  Administered 2016-08-26: 2 via ORAL

## 2016-08-26 MED ORDER — ROCURONIUM BROMIDE 100 MG/10ML IV SOLN
INTRAVENOUS | Status: DC | PRN
Start: 2016-08-26 — End: 2016-08-26
  Administered 2016-08-26: 10 mg via INTRAVENOUS
  Administered 2016-08-26: 40 mg via INTRAVENOUS

## 2016-08-26 MED ORDER — FENTANYL CITRATE (PF) 100 MCG/2ML IJ SOLN
INTRAMUSCULAR | Status: DC | PRN
  Administered 2016-08-26 (×6): 50 ug via INTRAVENOUS

## 2016-08-26 MED ORDER — ALUM & MAG HYDROXIDE-SIMETH 200-200-20 MG/5ML PO SUSP: 30.0000 mL | Freq: Four times a day (QID) | ORAL | Status: DC | PRN

## 2016-08-26 MED ORDER — FENTANYL CITRATE (PF) 100 MCG/2ML IJ SOLN
25.0000 ug | INTRAMUSCULAR | Status: DC | PRN
  Administered 2016-08-26 (×2): 50 ug via INTRAVENOUS

## 2016-08-26 MED ORDER — BUPIVACAINE-EPINEPHRINE (PF) 0.5% -1:200000 IJ SOLN
INTRAMUSCULAR | Status: AC
  Filled 2016-08-26: qty 30

## 2016-08-26 MED ORDER — MIDAZOLAM HCL 5 MG/5ML IJ SOLN
INTRAMUSCULAR | Status: DC | PRN
  Administered 2016-08-26 (×2): 1 mg via INTRAVENOUS

## 2016-08-26 MED ORDER — CEFAZOLIN SODIUM-DEXTROSE 2-4 GM/100ML-% IV SOLN: 2.0000 g | Freq: Three times a day (TID) | INTRAVENOUS | Status: DC

## 2016-08-26 MED ORDER — CEFAZOLIN SODIUM-DEXTROSE 2-4 GM/100ML-% IV SOLN
2.0000 g | INTRAVENOUS | Status: AC
  Administered 2016-08-26: 2 g via INTRAVENOUS
  Filled 2016-08-26: qty 100

## 2016-08-26 MED ORDER — LACTATED RINGERS IV SOLN: INTRAVENOUS | Status: DC

## 2016-08-26 MED ORDER — GLYCOPYRROLATE 0.2 MG/ML IV SOSY
PREFILLED_SYRINGE | INTRAVENOUS | Status: AC
  Filled 2016-08-26: qty 3

## 2016-08-26 MED ORDER — MORPHINE SULFATE (PF) 2 MG/ML IV SOLN: 1.0000 mg | INTRAVENOUS | Status: DC | PRN

## 2016-08-26 MED ORDER — MENTHOL 3 MG MT LOZG: 1.0000 | LOZENGE | OROMUCOSAL | Status: DC | PRN

## 2016-08-26 MED ORDER — DIAZEPAM 5 MG PO TABS
ORAL_TABLET | ORAL | Status: AC
  Filled 2016-08-26: qty 1

## 2016-08-26 MED ORDER — FENTANYL CITRATE (PF) 100 MCG/2ML IJ SOLN
INTRAMUSCULAR | Status: AC
  Filled 2016-08-26: qty 2

## 2016-08-26 MED ORDER — BUPIVACAINE-EPINEPHRINE 0.5% -1:200000 IJ SOLN
INTRAMUSCULAR | Status: DC | PRN
  Administered 2016-08-26: 10 mL

## 2016-08-26 MED ORDER — ROCURONIUM BROMIDE 10 MG/ML (PF) SYRINGE
PREFILLED_SYRINGE | INTRAVENOUS | Status: AC
  Filled 2016-08-26: qty 10

## 2016-08-26 MED ORDER — LACTATED RINGERS IV SOLN
INTRAVENOUS | Status: DC | PRN
  Administered 2016-08-26: 08:00:00 via INTRAVENOUS

## 2016-08-26 MED ORDER — PANTOPRAZOLE SODIUM 40 MG PO TBEC: 80.0000 mg | DELAYED_RELEASE_TABLET | Freq: Every day | ORAL | Status: DC

## 2016-08-26 MED ORDER — SUGAMMADEX SODIUM 200 MG/2ML IV SOLN
INTRAVENOUS | Status: DC | PRN
  Administered 2016-08-26: 110.4 mg via INTRAVENOUS

## 2016-08-26 MED ORDER — CLONAZEPAM 0.5 MG PO TABS: 1.0000 mg | ORAL_TABLET | Freq: Three times a day (TID) | ORAL | Status: DC

## 2016-08-26 MED ORDER — IOPAMIDOL (ISOVUE-300) INJECTION 61%
INTRAVENOUS | Status: DC | PRN
  Administered 2016-08-26: 50 mL

## 2016-08-26 MED ORDER — PROPOFOL 10 MG/ML IV BOLUS
INTRAVENOUS | Status: DC | PRN
  Administered 2016-08-26: 110 mg via INTRAVENOUS
  Administered 2016-08-26: 30 mg via INTRAVENOUS

## 2016-08-26 SURGICAL SUPPLY — 37 items
BENZOIN TINCTURE PRP APPL 2/3 (GAUZE/BANDAGES/DRESSINGS) ×2 IMPLANT
BLADE CLIPPER SURG (BLADE) IMPLANT
BLADE SURG 15 STRL LF DISP TIS (BLADE) ×1 IMPLANT
BLADE SURG 15 STRL SS (BLADE) ×1
CEMENT KYPHON C01A KIT/MIXER (Cement) ×2 IMPLANT
DRAPE C-ARM 42X72 X-RAY (DRAPES) ×2 IMPLANT
DRAPE HALF SHEET 40X57 (DRAPES) ×2 IMPLANT
DRAPE INCISE IOBAN 66X45 STRL (DRAPES) ×2 IMPLANT
DRAPE LAPAROTOMY 100X72X124 (DRAPES) ×2 IMPLANT
DRAPE SURG 17X23 STRL (DRAPES) ×8 IMPLANT
DRAPE WARM FLUID 44X44 (DRAPE) ×2 IMPLANT
GAUZE SPONGE 4X4 12PLY STRL (GAUZE/BANDAGES/DRESSINGS) ×2 IMPLANT
GAUZE SPONGE 4X4 16PLY XRAY LF (GAUZE/BANDAGES/DRESSINGS) ×2 IMPLANT
GLOVE BIO SURGEON STRL SZ8 (GLOVE) ×2 IMPLANT
GLOVE BIO SURGEON STRL SZ8.5 (GLOVE) ×2 IMPLANT
GLOVE BIOGEL PI IND STRL 7.0 (GLOVE) ×1 IMPLANT
GLOVE BIOGEL PI IND STRL 7.5 (GLOVE) ×1 IMPLANT
GLOVE BIOGEL PI INDICATOR 7.0 (GLOVE) ×1
GLOVE BIOGEL PI INDICATOR 7.5 (GLOVE) ×1
GLOVE INDICATOR 7.5 STRL GRN (GLOVE) ×4 IMPLANT
GOWN STRL REUS W/ TWL LRG LVL3 (GOWN DISPOSABLE) ×1 IMPLANT
GOWN STRL REUS W/ TWL XL LVL3 (GOWN DISPOSABLE) IMPLANT
GOWN STRL REUS W/TWL LRG LVL3 (GOWN DISPOSABLE) ×1
GOWN STRL REUS W/TWL XL LVL3 (GOWN DISPOSABLE)
KIT BASIN OR (CUSTOM PROCEDURE TRAY) ×2 IMPLANT
KIT ROOM TURNOVER OR (KITS) ×2 IMPLANT
NEEDLE HYPO 22GX1.5 SAFETY (NEEDLE) ×2 IMPLANT
NS IRRIG 1000ML POUR BTL (IV SOLUTION) ×2 IMPLANT
PACK EENT II TURBAN DRAPE (CUSTOM PROCEDURE TRAY) ×2 IMPLANT
PAD ARMBOARD 7.5X6 YLW CONV (MISCELLANEOUS) ×6 IMPLANT
STRIP CLOSURE SKIN 1/2X4 (GAUZE/BANDAGES/DRESSINGS) ×2 IMPLANT
SUT VIC AB 3-0 SH 8-18 (SUTURE) ×2 IMPLANT
SYR CONTROL 10ML LL (SYRINGE) ×2 IMPLANT
TAPE CLOTH SURG 4X10 WHT LF (GAUZE/BANDAGES/DRESSINGS) ×2 IMPLANT
TOWEL OR 17X24 6PK STRL BLUE (TOWEL DISPOSABLE) ×2 IMPLANT
TOWEL OR 17X26 10 PK STRL BLUE (TOWEL DISPOSABLE) ×2 IMPLANT
TRAY KYPHOPAK 15/3 ONESTEP 1ST (MISCELLANEOUS) ×2 IMPLANT

## 2016-08-26 NOTE — Anesthesia Procedure Notes (Signed)
Procedure Name: Intubation Date/Time: 08/26/2016 8:51 AM Performed by: Scheryl Darter Pre-anesthesia Checklist: Patient identified, Emergency Drugs available, Suction available and Patient being monitored Patient Re-evaluated:Patient Re-evaluated prior to inductionOxygen Delivery Method: Circle System Utilized Preoxygenation: Pre-oxygenation with 100% oxygen Intubation Type: IV induction Ventilation: Mask ventilation without difficulty Laryngoscope Size: Miller and 2 Grade View: Grade I Tube type: Oral Number of attempts: 1 Airway Equipment and Method: Stylet Placement Confirmation: ETT inserted through vocal cords under direct vision,  positive ETCO2 and breath sounds checked- equal and bilateral Secured at: 22 cm Tube secured with: Tape Dental Injury: Teeth and Oropharynx as per pre-operative assessment

## 2016-08-26 NOTE — Anesthesia Postprocedure Evaluation (Signed)
Anesthesia Post Note  Patient: Sherri Olsen  Procedure(s) Performed: Procedure(s) (LRB): LUMBAR ONE KYPHOPLASTY (N/A)  Patient location during evaluation: PACU Anesthesia Type: General Level of consciousness: awake and alert Pain management: pain level controlled Vital Signs Assessment: post-procedure vital signs reviewed and stable Respiratory status: spontaneous breathing, nonlabored ventilation and respiratory function stable Cardiovascular status: blood pressure returned to baseline and stable Postop Assessment: no signs of nausea or vomiting Anesthetic complications: no    Last Vitals:  Vitals:   08/26/16 1109 08/26/16 1110  BP:  121/78  Pulse: 95 95  Resp:  11  Temp:  36.1 C    Last Pain:  Vitals:   08/26/16 1110  TempSrc:   PainSc: 3                  Isaura Schiller,W. EDMOND

## 2016-08-26 NOTE — Op Note (Signed)
Brief history: The patient is a 70 year old white female who has had chronic back pain. She took a fall and developed a L1 compression fracture. She failed medical management. I discussed the various treatment options including an L1 kyphoplasty. The patient has weighed the risks, benefits, and alternatives to surgery and decided to proceed with an L1 kyphoplasty.  Preop diagnosis: L1 compression fracture, lumbago  Postop diagnosis: Same  Procedure: L1 kyphoplasty  Surgeon: Dr. Earle Gell  Assistant: None  Anesthesia: Gen. endotracheal  Estimated blood loss: Minimal  Specimens: None  Drains: None  Complications: None  Description of procedure: Patient was brought to the operating room by the anesthesia team. General endotracheal anesthesia was induced. The patient was turned to the prone position on the chest rolls. Her lumbosacral region was then prepared with Betadine scrub and Betadine solution. Sterile drapes were applied. I then made small incisions over the patient's bilateral L1 pedicles. I then inserted the Jamshidi needle into the L1 pedicles under biplanar fluoroscopy. I then removed the stylette and drilled through the pedicle into the vertebral body. I then removed the drill and inserted the balloons. We plated the balloons under fluoroscopic guidance. We then deflated the balloons. We then inserted bone cement under biplanar fluoroscopy. After we were satisfied with the surface of the bone cement we then removed the cannulas from the bilateral L1 pedicles. We then reapproximated patient's subcutaneous tissue with interrupted 3-0 Vicryl suture. We reapproximated the skin with Steri-Strips and benzoin. The wound was then coated with bacitracin ointment. A sterile dressing was applied. The drapes were removed. The patient was returned to the supine position. I reported all sponge, instrument, and needle counts were correct at the end of this case.

## 2016-08-26 NOTE — Transfer of Care (Signed)
Immediate Anesthesia Transfer of Care Note  Patient: Sherri Olsen  Procedure(s) Performed: Procedure(s) with comments: LUMBAR ONE KYPHOPLASTY (N/A) - KYPHOPLASTY L1  Patient Location: PACU  Anesthesia Type:General  Level of Consciousness: awake, alert , oriented and sedated  Airway & Oxygen Therapy: Patient Spontanous Breathing and Patient connected to nasal cannula oxygen  Post-op Assessment: Report given to RN, Post -op Vital signs reviewed and stable and Patient moving all extremities  Post vital signs: Reviewed and stable  Last Vitals:  Vitals:   08/26/16 0707 08/26/16 1018  BP: (!) 164/73 (!) 121/110  Pulse: (!) 54   Resp: 20   Temp: 36.6 C 36.4 C    Last Pain:  Vitals:   08/26/16 0728  TempSrc:   PainSc: 7          Complications: No apparent anesthesia complications

## 2016-08-26 NOTE — H&P (Signed)
Subjective: The patient is a 70 year old white female who's had chronic back pain. More recently she took a fall and suffered a L1 compression fracture. She has failed medical management and was worked up with lumbar x-rays in the lumbar MRI. We discussed the various treatment options including surgery. The patient has weighed the risks, benefits, and alternatives to surgery and decided to proceed with an L1 kyphoplasty.  Past Medical History:  Diagnosis Date  . Anemia   . Anxiety   . Anxiety and depression   . Colon polyps    adenomatous  . Constipation   . GERD (gastroesophageal reflux disease)   . Headache   . IBS (irritable bowel syndrome)   . Melanosis coli   . Osteoporosis   . Ulcerative colitis ([place])    ?    Past Surgical History:  Procedure Laterality Date  . ABDOMINAL HYSTERECTOMY    . APPENDECTOMY    . CHOLECYSTECTOMY  2016  . COLONOSCOPY W/ POLYPECTOMY    . herniated disk    . HIP ARTHROSCOPY Right    debridement and ligament  . SACROILIAC JOINT FUSION Right 1987   Hip  . TONSILLECTOMY      Allergies  Allergen Reactions  . No Known Allergies     Social History  Substance Use Topics  . Smoking status: Never Smoker  . Smokeless tobacco: Never Used  . Alcohol use No    Family History  Problem Relation Age of Onset  . Colon cancer Father 68  . Colon cancer Maternal Grandmother   . Ulcerative colitis Maternal Uncle   . Diabetes Maternal Uncle   . Irritable bowel syndrome Maternal Uncle   . Diverticulitis Mother    Prior to Admission medications   Medication Sig Start Date End Date Taking? Authorizing Provider  bisacodyl (DULCOLAX) 5 MG EC tablet Take 10 mg by mouth daily as needed for moderate constipation.   Yes Historical Provider, MD  clonazePAM (KLONOPIN) 1 MG tablet Take 1 mg by mouth 3 (three) times daily.    Yes Historical Provider, MD  HYDROcodone-acetaminophen (NORCO/VICODIN) 5-325 MG tablet Take 1 tablet by mouth every 4 (four) hours as needed  for moderate pain or severe pain.   Yes Historical Provider, MD  loperamide (IMODIUM) 2 MG capsule Take 2 mg by mouth as needed for diarrhea or loose stools.   Yes Historical Provider, MD  omeprazole (PRILOSEC) 40 MG capsule Take 40 mg by mouth 2 (two) times daily.  02/05/10  Yes Historical Provider, MD  ranitidine (ZANTAC) 150 MG tablet Take 150 mg by mouth daily as needed for heartburn.    Yes Historical Provider, MD  zolpidem (AMBIEN) 10 MG tablet Take 10 mg by mouth at bedtime.    Yes Historical Provider, MD     Review of Systems  Positive ROS: As above  All other systems have been reviewed and were otherwise negative with the exception of those mentioned in the HPI and as above.  Objective: Vital signs in last 24 hours: Temp:  [97.8 F (36.6 C)-98.2 F (36.8 C)] 97.8 F (36.6 C) (10/18 0707) Pulse Rate:  [54-68] 54 (10/18 0707) Resp:  [20] 20 (10/18 0707) BP: (134-164)/(69-73) 164/73 (10/18 0707) SpO2:  [96 %-100 %] 100 % (10/18 0707) Weight:  [55.2 kg (121 lb 11.2 oz)] 55.2 kg (121 lb 11.2 oz) (10/17 1316)  General Appearance: Alert, cooperative, no distress, Head: Normocephalic, without obvious abnormality, atraumatic Eyes: PERRL, conjunctiva/corneas clear, EOM's intact,    Ears: Normal  Throat:  Normal  Neck: Supple, symmetrical, trachea midline, no adenopathy; thyroid: No enlargement/tenderness/nodules; no carotid bruit or JVD Back: Symmetric, no curvature, ROM normal, no CVA tenderness Lungs: Clear to auscultation bilaterally, respirations unlabored Heart: Regular rate and rhythm, no murmur, rub or gallop Abdomen: Soft, non-tender,, no masses, no organomegaly Extremities: Extremities normal, atraumatic, no cyanosis or edema Pulses: 2+ and symmetric all extremities Skin: Skin color, texture, turgor normal, no rashes or lesions  NEUROLOGIC:   Mental status: alert and oriented, no aphasia, good attention span, Fund of knowledge/ memory ok Motor Exam - grossly  normal Sensory Exam - grossly normal Reflexes:  Coordination - grossly normal Gait - grossly normal Balance - grossly normal Cranial Nerves: I: smell Not tested  II: visual acuity  OS: Normal  OD: Normal   II: visual fields Full to confrontation  II: pupils Equal, round, reactive to light  III,VII: ptosis None  III,IV,VI: extraocular muscles  Full ROM  V: mastication Normal  V: facial light touch sensation  Normal  V,VII: corneal reflex  Present  VII: facial muscle function - upper  Normal  VII: facial muscle function - lower Normal  VIII: hearing Not tested  IX: soft palate elevation  Normal  IX,X: gag reflex Present  XI: trapezius strength  5/5  XI: sternocleidomastoid strength 5/5  XI: neck flexion strength  5/5  XII: tongue strength  Normal    Data Review Lab Results  Component Value Date   WBC 6.3 08/25/2016   HGB 12.3 08/25/2016   HCT 38.1 08/25/2016   MCV 89.2 08/25/2016   PLT 322 08/25/2016   No results found for: NA, K, CL, CO2, BUN, CREATININE, GLUCOSE No results found for: INR, PROTIME  Assessment/Plan: L1 fracture, lumbago: I have discussed the situation with the patient and her husband. I have reviewed her imaging studies with them and pointed out the abnormalities. We have discussed the various treatment options including surgery. I have described the surgical treatment option of a L1 kyphoplasty. I have shown her surgical models. I have given her an Engineer, manufacturing systems. We have discussed the risks, benefits, alternatives, expected postoperative course, and likelihood of achieving our goals with surgery. I have answered all her questions. She has decided to proceed with surgery.   [place] D 08/26/2016 8:22 AM

## 2016-08-26 NOTE — Progress Notes (Signed)
Orthopedic Tech Progress Note Patient Details:  Sherri Olsen 11/29/1945 [id number]  Patient ID: Sherri Olsen, female   DOB: Feb 21, 1946, 70 y.o.   MRN: [id number]   Sherri Olsen 08/26/2016, 11:30 AM Called in bio-tech brace order; spoke with Colletta [place]

## 2016-08-26 NOTE — Discharge Summary (Signed)
  Physician Discharge Summary  Patient ID: Sherri Olsen MRN: [id number] DOB/AGE: 1946/08/10 70 y.o.  Admit date: 08/26/2016 Discharge date: 08/26/2016  Admission Diagnoses:L1 compression fracture, lumbago  Discharge Diagnoses: The same Active Problems:   Lumbar compression fracture, with delayed healing, subsequent encounter   Discharged Condition: good  Hospital Course: I performed an L1 kyphoplasty on the patient on 08/26/2016. The surgery went well.  The patient's postoperative course was unremarkable. The patient's husband was given discharge instructions. All his questions were answered. Discussed the proper use and the precautions of of the pain medications.  Consults: Physical therapy Significant Diagnostic Studies: None Treatments: L1 kyphoplasty Discharge Exam: Blood pressure (!) 139/97, pulse (!) 113, temperature 97.5 F (36.4 C), resp. rate 16, SpO2 96 %. The patient is alert and pleasant. She is moving her lower extremities well.  Disposition: Home     Medication List    STOP taking these medications   HYDROcodone-acetaminophen 5-325 MG tablet Commonly known as:  NORCO/VICODIN   zolpidem 10 MG tablet Commonly known as:  AMBIEN     TAKE these medications   bisacodyl 5 MG EC tablet Commonly known as:  DULCOLAX Take 10 mg by mouth daily as needed for moderate constipation.   clonazePAM 1 MG tablet Commonly known as:  KLONOPIN Take 1 mg by mouth 3 (three) times daily.   loperamide 2 MG capsule Commonly known as:  IMODIUM Take 2 mg by mouth as needed for diarrhea or loose stools.   omeprazole 40 MG capsule Commonly known as:  PRILOSEC Take 40 mg by mouth 2 (two) times daily.   oxyCODONE-acetaminophen 5-325 MG tablet Commonly known as:  PERCOCET/ROXICET Take 1-2 tablets by mouth every 4 (four) hours as needed for moderate pain.   ranitidine 150 MG tablet Commonly known as:  ZANTAC Take 150 mg by mouth daily as needed for heartburn.       Follow-up Information    Ophelia Charter, MD .   Specialty:  Neurosurgery Contact information: [address] [place] [address] [place] [address] [phone]           Signed: Ophelia Charter 08/26/2016, 10:47 AM

## 2016-08-26 NOTE — Progress Notes (Signed)
Discharged instructions/education/ AVS/Rx given to patient with family at bedside and they verbalized understanding. Pain is mild to moderate, MAE well, voiding well. No redness, no drainage, no swelling noted on incision site. Patient d/c via wheelchair.

## 2016-08-26 NOTE — Progress Notes (Signed)
Orthopedic Tech Progress Note Patient Details:  Sherri Olsen [birth year] [id number] Brace order completed by bio-tech  Patient ID: Sherri Olsen, female   DOB: 08/19/1946, 70 y.o.   MRN: [id number]   Braulio Bosch 08/26/2016, 3:00 PM

## 2016-08-27 ENCOUNTER — Encounter (HOSPITAL_COMMUNITY): Payer: Self-pay | Admitting: Neurosurgery

## 2016-09-10 ENCOUNTER — Telehealth: Payer: Self-pay | Admitting: Internal Medicine

## 2016-09-10 ENCOUNTER — Other Ambulatory Visit: Payer: Self-pay | Admitting: Internal Medicine

## 2016-09-10 NOTE — Telephone Encounter (Signed)
Lomotil refilled °

## 2016-11-17 ENCOUNTER — Telehealth: Payer: Self-pay | Admitting: Internal Medicine

## 2016-11-18 MED ORDER — DIPHENOXYLATE-ATROPINE 2.5-0.025 MG PO TABS: ORAL_TABLET | ORAL | 0 refills | Status: DC

## 2016-11-18 NOTE — Telephone Encounter (Signed)
Patient calling in regarding this.  °

## 2016-11-18 NOTE — Telephone Encounter (Signed)
Refilled Lomotil 

## 2016-12-07 ENCOUNTER — Other Ambulatory Visit: Payer: Self-pay | Admitting: Internal Medicine

## 2016-12-08 ENCOUNTER — Telehealth: Payer: Self-pay | Admitting: Internal Medicine

## 2016-12-08 NOTE — Telephone Encounter (Signed)
Filled Lomotil on 11/17/2016.  Received this message today.  Please advise.

## 2016-12-08 NOTE — Telephone Encounter (Signed)
Declined

## 2016-12-09 NOTE — Telephone Encounter (Signed)
Called to tell patient that Dr. Henrene Pastor declined to give her another prescription of Lomotil.  Patien'ts mailbox was full

## 2017-01-07 ENCOUNTER — Telehealth: Payer: Self-pay | Admitting: Internal Medicine

## 2017-01-08 NOTE — Telephone Encounter (Signed)
Pt is calling back about her Lomotil

## 2017-01-08 NOTE — Telephone Encounter (Signed)
Told patient I would be able to give her lomotil on 01/16/2017.  Patient is going to call back at that time.

## 2017-01-15 ENCOUNTER — Telehealth: Payer: Self-pay

## 2017-01-15 MED ORDER — LOPERAMIDE HCL 2 MG PO CAPS: 2.0000 mg | ORAL_CAPSULE | ORAL | 0 refills | Status: AC | PRN

## 2017-01-15 MED ORDER — LOPERAMIDE HCL 2 MG PO CAPS: 2.0000 mg | ORAL_CAPSULE | ORAL | 0 refills | Status: DC | PRN

## 2017-01-15 NOTE — Telephone Encounter (Signed)
Imodium faxed to [facility]

## 2017-01-15 NOTE — Telephone Encounter (Deleted)
Imodium faxed to [facility]

## 2017-01-15 NOTE — Telephone Encounter (Deleted)
Faxed Lomotil to [facility]

## 2017-01-15 NOTE — Telephone Encounter (Signed)
Faxed Imodium to [facility]

## 2017-02-06 IMAGING — RF DG LUMBAR SPINE 2-3V
1 series · 1 of 1 positions shown · non-contrast
Comparison: None.

CLINICAL DATA: L1 kyphoplasty

EXAM:
DG C-ARM 61-120 MIN; LUMBAR SPINE - 2-3 VIEW

[Series 1: run · 1 of 1 slices shown]
[im 1/1]
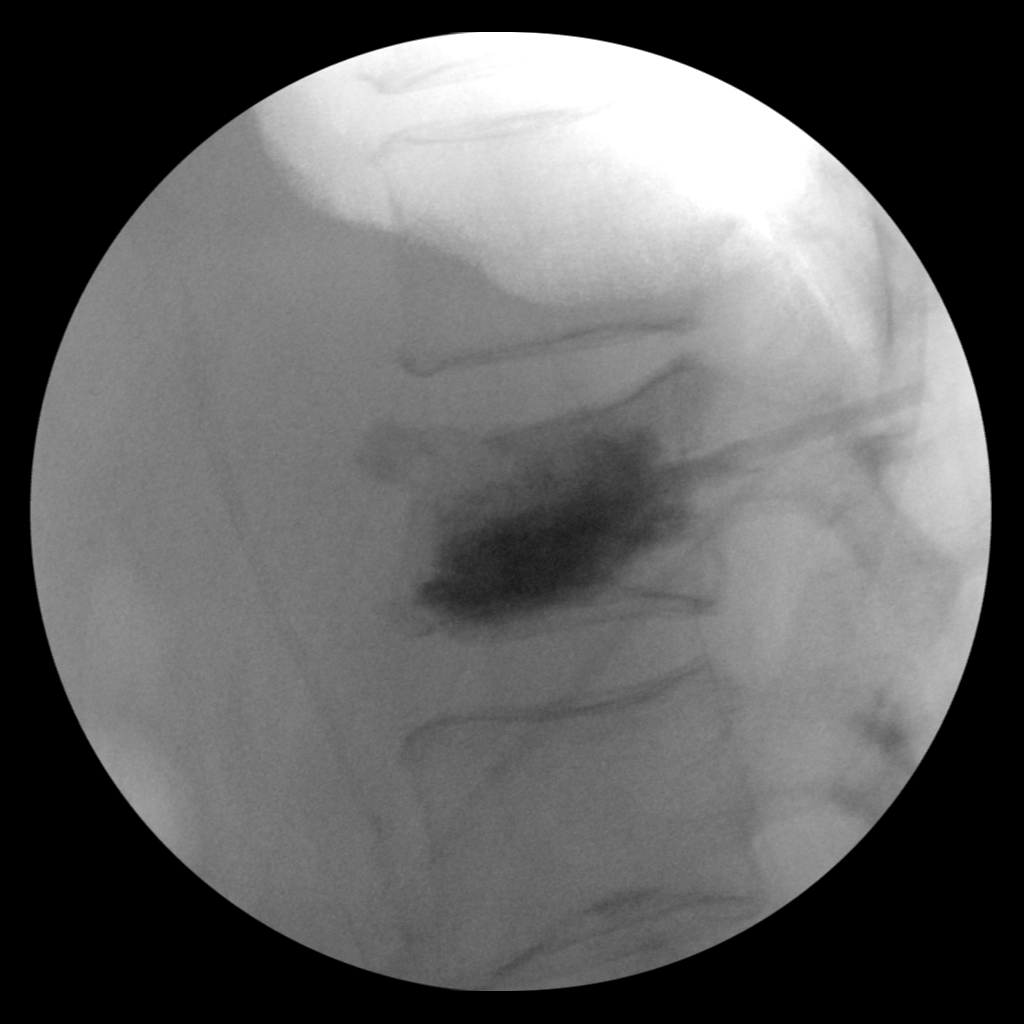

[1 of 1 positions shown; findings below may reference images not displayed]

FLUOROSCOPY TIME:  Fluoroscopy Time:  1 minutes 10 seconds

Radiation Exposure Index (if provided by the fluoroscopic device):
Not available

Number of Acquired Spot Images: 2
FINDINGS: Two spot films were obtained intraoperatively and reveal contrast
laden cement within the L1 vertebral body. Some mild intravasation
is noted laterally on the left. Pedicular cement is also noted
bilaterally.
IMPRESSION: Status post L1 kyphoplasty

## 2017-02-18 ENCOUNTER — Telehealth: Payer: Self-pay | Admitting: Internal Medicine

## 2017-02-19 ENCOUNTER — Telehealth: Payer: Self-pay

## 2017-02-24 ENCOUNTER — Telehealth: Payer: Self-pay

## 2017-02-24 MED ORDER — DIPHENOXYLATE-ATROPINE 2.5-0.025 MG PO TABS: ORAL_TABLET | ORAL | 0 refills | Status: DC

## 2017-02-24 NOTE — Telephone Encounter (Signed)
Spoke to patient - had inadvertently looked at the Imodium rx by mistake.  Patient was, in fact, due for a refill on Lomotil.  Faxed refill.  Patient aware.

## 2017-02-24 NOTE — Telephone Encounter (Signed)
Patient not due for a refill of Lomotil until 03/17/2017.  Called several times and got repeated busy signal.

## 2017-02-24 NOTE — Telephone Encounter (Signed)
Repeated busy signal

## 2017-03-10 ENCOUNTER — Telehealth: Payer: Self-pay | Admitting: Internal Medicine

## 2017-03-10 NOTE — Telephone Encounter (Signed)
Pt states she was seen in [facility] and tested positive for bacterial overgrowth, she was placed on several antibiotics. Pt states that since she was placed on these antibiotics he stool will just ooze out and she doesn't even know it. Instructed pt to call [facility], Dr. Dava Najjar office if she was worse since taking the antibiotics. Pt verbalized understanding.

## 2017-04-16 ENCOUNTER — Ambulatory Visit: Payer: Medicare Other | Admitting: Internal Medicine

## 2017-04-28 ENCOUNTER — Encounter: Payer: Self-pay | Admitting: Physical Therapy

## 2017-04-28 ENCOUNTER — Ambulatory Visit: Payer: Medicare Other | Attending: Gastroenterology | Admitting: Physical Therapy

## 2017-04-28 DIAGNOSIS — M6281 Muscle weakness (generalized): Secondary | ICD-10-CM | POA: Insufficient documentation

## 2017-04-28 DIAGNOSIS — R252 Cramp and spasm: Secondary | ICD-10-CM | POA: Diagnosis present

## 2017-04-28 NOTE — Patient Instructions (Signed)
About Abdominal Massage  Abdominal massage, also called external colon massage, is a self-treatment circular massage technique that can reduce and eliminate gas and ease constipation. The colon naturally contracts in waves in a clockwise direction starting from inside the right hip, moving up toward the ribs, across the belly, and down inside the left hip.  When you perform circular abdominal massage, you help stimulate your colon's normal wave pattern of movement called peristalsis.  It is most beneficial when done after eating.  Positioning You can practice abdominal massage with oil while lying down, or in the shower with soap.  Some people find that it is just as effective to do the massage through clothing while sitting or standing.  How to Massage Start by placing your finger tips or knuckles on your right side, just inside your hip bone.  . Make small circular movements while you move upward toward your rib cage.   . Once you reach the bottom right side of your rib cage, take your circular movements across to the left side of the bottom of your rib cage.  . Next, move downward until you reach the inside of your left hip bone.  This is the path your feces travel in your colon. . Continue to perform your abdominal massage in this pattern for 10 minutes each day.     You can apply as much pressure as is comfortable in your massage.  Start gently and build pressure as you continue to practice.  Notice any areas of pain as you massage; areas of slight pain may be relieved as you massage, but if you have areas of significant or intense pain, consult with your healthcare provider.  Other Considerations . General physical activity including bending and stretching can have a beneficial massage-like effect on the colon.  Deep breathing can also stimulate the colon because breathing deeply activates the same nervous system that supplies the colon.   . Abdominal massage should always be used in  combination with a bowel-conscious diet that is high in the proper type of fiber for you, fluids (primarily water), and a regular exercise program. [facility] [address], [address] [place], [place] [address] Phone # [phone] Fax [phone]  

## 2017-04-28 NOTE — Therapy (Signed)
[facility] [address], [place], [place], [address] Phone: [phone]   Fax:  [phone]  Physical Therapy Evaluation  Patient Details  Name: Sherri Olsen MRN: [id number] Date of Birth: [birth year] Referring Provider: Dr. Renaldo Fiddler  Encounter Date: 04/28/2017      PT End of Session - 04/28/17 2110    Visit Number 1   Number of Visits 10   Date for PT Re-Evaluation 06/23/17   Authorization Type Medicare g-code 10th visit; kx modifier on 15th visit   PT Start Time 1530   PT Stop Time 1615   PT Time Calculation (min) 45 min   Activity Tolerance Patient tolerated treatment well   Behavior During Therapy [facility] for tasks assessed/performed      Past Medical History:  Diagnosis Date  . Anemia   . Anxiety   . Anxiety and depression   . Colon polyps    adenomatous  . Constipation   . GERD (gastroesophageal reflux disease)   . Headache   . IBS (irritable bowel syndrome)   . Melanosis coli   . Osteoporosis   . Ulcerative colitis ([place])    ?    Past Surgical History:  Procedure Laterality Date  . ABDOMINAL HYSTERECTOMY    . APPENDECTOMY    . CHOLECYSTECTOMY  2016  . COLONOSCOPY W/ POLYPECTOMY    . herniated disk    . HIP ARTHROSCOPY Right    debridement and ligament  . KYPHOPLASTY N/A 08/26/2016   Procedure: LUMBAR ONE KYPHOPLASTY;  Surgeon: Newman Pies, MD;  Location: [place];  Service: Neurosurgery;  Laterality: N/A;  KYPHOPLASTY L1  . SACROILIAC JOINT FUSION Right 1987   Hip  . TONSILLECTOMY      There were no vitals filed for this visit.       Subjective Assessment - 04/28/17 1538    Subjective Has diahrrhea or constipation that goes back and forth, abdominal pain. Occasional stool leakage. Does not go out as much due to constipation.    Patient Stated Goals improve muscle control   Currently in Pain? Yes   Pain Score 8   low 4/10   Pain Location Abdomen   Pain Orientation  Lower   Pain Descriptors / Indicators Dull   Pain Type Chronic pain   Pain Frequency Other (Comment)  constant for 3-4 days then go away for several days   Aggravating Factors  not going to the bathroom   Pain Relieving Factors medication   Multiple Pain Sites No            [facility] PT Assessment - 04/28/17 0001      Assessment   Medical Diagnosis K58.0 Irritable bowel syndrome with diarrhea; K59.00 Constipation, unspecified constipation   Referring Provider Dr. Renaldo Fiddler   Prior Therapy None     Precautions   Precautions Other (comment)   Precaution Comments osteoporosis     Restrictions   Weight Bearing Restrictions No     Balance Screen   Has the patient fallen in the past 6 months No   Has the patient had a decrease in activity level because of a fear of falling?  No   Is the patient reluctant to leave their home because of a fear of falling?  No     Home Ecologist residence     Prior Function   Level of Independence Independent   Vocation Retired   Leisure read, walks  Cognition   Overall Cognitive Status Within Functional Limits for tasks assessed     Observation/Other Assessments   Focus on Therapeutic Outcomes (FOTO)  45% limitation Bowel survey  goal is 36% limitation     Posture/Postural Control   Posture/Postural Control No significant limitations     ROM / Strength   AROM / PROM / Strength AROM;PROM;Strength     AROM   Lumbar Extension decreased by 50%   Lumbar - Right Side Bend decreased by 25%     Strength   Right Hip Extension 2+/5   Right Hip ABduction 3/5   Left Hip Extension 2+/5   Left Hip ABduction 3/5     Palpation   SI assessment  pelvis in correct alignment   Palpation comment tenderness located in right levator ani and right piriformis, several trigger points in the abdominal muscles with tightness     Transfers   Transfers Not assessed     Ambulation/Gait   Ambulation/Gait No             Objective measurements completed on examination: See above findings.        Pelvic Floor Special Questions - 04/28/17 0001    Prior Pregnancies Yes   Number of Vaginal Deliveries 1   Currently Sexually Active Yes   Is this Painful Yes   Marinoff Scale discomfort that does not affect completion   Urinary Leakage No   Fecal incontinence Yes   Caffeine beverages 2-5 cups of coffee and drink soda   External Perineal Exam deferred   Exam Type Deferred                  PT Education - 04/28/17 2109    Education provided Yes   Education Details abdominal massage and handout for irritable bowel syndrome diet with foods that could irritate it   Person(s) Educated Patient   Methods Explanation;Verbal cues;Handout;Demonstration   Comprehension Verbalized understanding          PT Short Term Goals - 04/28/17 2119      PT SHORT TERM GOAL #1   Title independent with abdominal massage to reduce pain and improve toileting with constipation   Time 4   Period Weeks   Status New     PT SHORT TERM GOAL #2   Title understand how to contract the pelvic floor muscles with pelvic floor EMG to strengthen the anal sphinter   Time 4   Period Weeks   Status New     PT SHORT TERM GOAL #3   Title understand foods that may affect her irritable bowel syndrome so she is able to manage the constipation and diarrhea    Time 4   Period Weeks   Status New           PT Long Term Goals - 04/28/17 2121      PT LONG TERM GOAL #1   Title independent with HEP to strengthen the anal sphincter muscles   Time 8   Period Weeks   Status New     PT LONG TERM GOAL #2   Title abdominal pain decreased so she is able to be 50% more social with friends   Time 8   Period Weeks   Status New     PT LONG TERM GOAL #3   Title bilateral hip strength >/= 4/5 to assist in pelvic floor strength   Time 8   Period Weeks   Status New     PT LONG  TERM GOAL #4   Title FOTO score is  </= 36% limitation                Plan - 04/28/17 2111    Clinical Impression Statement Patient is a 71 year old female with irritable bowel syndrome and constipation.  Patient reports pain in lower abdomen at level 8/10 that is intermittent.  Patient reports the pain, intemittent diahrrehea, and intermittent constipation prevents her from being social with her friends and will stay in her home.  Palpable tenderness located in right levator ani and piriformis.  Patient deferred assessment of the vaginal or rectal area.  Lumbar ROM is decreased.  Bilateral hip abduction strength is 3/5 and extension is 2+/5.  Marinoff score is 1/3 due to pain with intercourse.  Patient will drink 2-5 cups of caffienated coffee and soda. Patient will benefit from skilled therapy to reduce pain and decreased trigger points so she is able to be social and have improved quality of life.    History and Personal Factors relevant to plan of care: vaginal hysterectomy; osteoporosis, kyphoplasty 08/26/2016; right SI fusion; and gall baldder removal 2016   Clinical Presentation Evolving   Clinical Presentation due to: pain and alterate constipation and diarrhea prevents patient from being social   Clinical Decision Making Moderate   Rehab Potential Excellent   Clinical Impairments Affecting Rehab Potential osteoporosis; right SI fusion   PT Frequency 1x / week   PT Duration 8 weeks  coming every other week   PT Treatment/Interventions Moist Heat;Biofeedback;Cryotherapy;Ultrasound;Therapeutic activities;Therapeutic exercise;Neuromuscular re-education;Patient/family education;Scar mobilization;Manual techniques;Dry needling;Energy conservation   PT Next Visit Plan abdominal soft tissue; toileting, stretches, pelvic floor strength with pelvic EMG   PT Home Exercise Plan progress as needed   Consulted and Agree with Plan of Care Patient      Patient will benefit from skilled therapeutic intervention in order to  improve the following deficits and impairments:  Decreased coordination, Increased fascial restricitons, Decreased endurance, Decreased activity tolerance, Increased muscle spasms, Pain, Decreased strength, Decreased mobility  Visit Diagnosis: Muscle weakness (generalized) - Plan: PT plan of care cert/re-cert  Cramp and spasm - Plan: PT plan of care cert/re-cert      G-Codes - 11/94/17 2124    Functional Assessment Tool Used (Outpatient Only) FOTO score is 45% limitation  FOTO score is 36% limitation   Functional Limitation Other PT primary   Other PT Primary Current Status (E0814) At least 40 percent but less than 60 percent impaired, limited or restricted   Other PT Primary Goal Status (G8185) At least 20 percent but less than 40 percent impaired, limited or restricted       Problem List Patient Active Problem List   Diagnosis Date Noted  . Lumbar compression fracture, with delayed healing, subsequent encounter 08/26/2016    Earlie Counts, PT 04/28/17 9:27 PM    [facility] [address] [place], [place], [address] Phone: [phone]   Fax:  [phone]  Name: Sherri Olsen MRN: [id number] Date of Birth: Nov 25, 1945

## 2017-05-17 ENCOUNTER — Ambulatory Visit: Payer: Medicare Other | Attending: Gastroenterology | Admitting: Physical Therapy

## 2017-05-17 ENCOUNTER — Encounter: Payer: Self-pay | Admitting: Physical Therapy

## 2017-05-17 DIAGNOSIS — R252 Cramp and spasm: Secondary | ICD-10-CM | POA: Insufficient documentation

## 2017-05-17 DIAGNOSIS — M6281 Muscle weakness (generalized): Secondary | ICD-10-CM | POA: Insufficient documentation

## 2017-05-17 NOTE — Therapy (Signed)
[facility] [address] [place], [place], [address] Phone: [phone]   Fax:  [phone]  Physical Therapy Treatment  Patient Details  Name: Sherri Olsen MRN: [id number] Date of Birth: 04-Feb-1946 Referring Provider: Dr. Renaldo Fiddler  Encounter Date: 05/17/2017      PT End of Session - 05/17/17 1226    Visit Number 2   Number of Visits 10   Date for PT Re-Evaluation 06/23/17   Authorization Type Medicare g-code 10th visit; kx modifier on 15th visit   PT Start Time 1226   PT Stop Time 1314   PT Time Calculation (min) 48 min   Activity Tolerance Patient tolerated treatment well   Behavior During Therapy [facility] for tasks assessed/performed      Past Medical History:  Diagnosis Date  . Anemia   . Anxiety   . Anxiety and depression   . Colon polyps    adenomatous  . Constipation   . GERD (gastroesophageal reflux disease)   . Headache   . IBS (irritable bowel syndrome)   . Melanosis coli   . Osteoporosis   . Ulcerative colitis ([place])    ?    Past Surgical History:  Procedure Laterality Date  . ABDOMINAL HYSTERECTOMY    . APPENDECTOMY    . CHOLECYSTECTOMY  2016  . COLONOSCOPY W/ POLYPECTOMY    . herniated disk    . HIP ARTHROSCOPY Right    debridement and ligament  . KYPHOPLASTY N/A 08/26/2016   Procedure: LUMBAR ONE KYPHOPLASTY;  Surgeon: Newman Pies, MD;  Location: [facility];  Service: Neurosurgery;  Laterality: N/A;  KYPHOPLASTY L1  . SACROILIAC JOINT FUSION Right 1987   Hip  . TONSILLECTOMY      There were no vitals filed for this visit.      Subjective Assessment - 05/17/17 1230    Subjective Protein helps more than high fiber.  I like vegetables, but that irritates me.  The last two days were very bad.  Denies pain at this moment.     Patient Stated Goals improve muscle control   Currently in Pain? No/denies                         [facility] Adult PT  Treatment/Exercise - 05/17/17 0001      Neuro Re-ed    Neuro Re-ed Details  diaphragmatic breathing with relaxation and double knee to chest and piriformis stretch     Manual Therapy   Manual Therapy Soft tissue mobilization;Myofascial release   Soft tissue mobilization left adductor and adductor stretch with tactile cues   Myofascial Release abdominal massage mostly upper from naval up to thoracic diaphragm, MFR in all planes                PT Education - 05/17/17 1315    Education provided Yes   Education Details balloon breathing, double knee to chest, butterfly stretch   Person(s) Educated Patient   Methods Explanation;Tactile cues;Verbal cues;Handout   Comprehension Verbalized understanding          PT Short Term Goals - 04/28/17 2119      PT SHORT TERM GOAL #1   Title independent with abdominal massage to reduce pain and improve toileting with constipation   Time 4   Period Weeks   Status New     PT SHORT TERM GOAL #2   Title understand how to contract the pelvic floor muscles with pelvic floor  EMG to strengthen the anal sphinter   Time 4   Period Weeks   Status New     PT SHORT TERM GOAL #3   Title understand foods that may affect her irritable bowel syndrome so she is able to manage the constipation and diarrhea    Time 4   Period Weeks   Status New           PT Long Term Goals - 04/28/17 2121      PT LONG TERM GOAL #1   Title independent with HEP to strengthen the anal sphincter muscles   Time 8   Period Weeks   Status New     PT LONG TERM GOAL #2   Title abdominal pain decreased so she is able to be 50% more social with friends   Time 8   Period Weeks   Status New     PT LONG TERM GOAL #3   Title bilateral hip strength >/= 4/5 to assist in pelvic floor strength   Time 8   Period Weeks   Status New     PT LONG TERM GOAL #4   Title FOTO score is </= 36% limitation               Plan - 05/17/17 1227    Clinical Impression  Statement Patient had some bowel motility palpated during myfacial release and some release of fascial restrictions that were greater in upper abdomen and across transverse colon.  She was able to perform diaphragmatic breathing with cues and added to HEP.  No goals met due to first visit since eval.  Pt will benefit from skilled PT for improved bowel function and pelvic floor strength.   Rehab Potential Excellent   Clinical Impairments Affecting Rehab Potential osteoporosis; right SI fusion   PT Treatment/Interventions Moist Heat;Biofeedback;Cryotherapy;Ultrasound;Therapeutic activities;Therapeutic exercise;Neuromuscular re-education;Patient/family education;Scar mobilization;Manual techniques;Dry needling;Energy conservation   PT Next Visit Plan toileting, stretches, abdominal, adductor massage, pelvic floor strength EMG, core strength gently with breathing   PT Home Exercise Plan progress as needed   Consulted and Agree with Plan of Care Patient      Patient will benefit from skilled therapeutic intervention in order to improve the following deficits and impairments:  Decreased coordination, Increased fascial restricitons, Decreased endurance, Decreased activity tolerance, Increased muscle spasms, Pain, Decreased strength, Decreased mobility  Visit Diagnosis: Muscle weakness (generalized)  Cramp and spasm     Problem List Patient Active Problem List   Diagnosis Date Noted  . Lumbar compression fracture, with delayed healing, subsequent encounter 08/26/2016    Sherri Olsen 05/17/2017, 1:22 PM  [facility] [address] [place], [place], [address] Phone: [phone]   Fax:  [phone]  Name: Sherri Olsen MRN: [id number] Date of Birth: 15-Jan-1946

## 2017-05-17 NOTE — Patient Instructions (Signed)
Balloon Breath    Place hands LIGHTLY on belly below navel. Imagine a balloon inside belly. Blow up balloon on breath IN, deflate balloon on breath OUT. Contract abdominals slightly to assist breath OUT.  1 min 2x/day   Copyright  VHI. All rights reserved.   Double Knee to Chest (Flexion)    Gently pull both knees toward chest. Feel stretch in lower back or buttock area. Breathing deeply, Hold _5 breaths. Repeat __3__ times/day  http://gt2.exer.us/228   Copyright  VHI. All rights reserved.   Adductor Stretch - Supine    Lie on floor, knees bent, feet flat. Keeping feet together, lower knees toward floor until stretch felt at inner thighs. Lie near a wall and you can rest leg against the wall.   Repeat _1 min. Do __1_ times per day.  Copyright  VHI. All rights reserved.

## 2017-06-03 ENCOUNTER — Ambulatory Visit: Payer: Medicare Other | Admitting: Physical Therapy

## 2017-06-03 ENCOUNTER — Ambulatory Visit: Payer: Medicare Other | Admitting: Internal Medicine

## 2017-06-03 ENCOUNTER — Encounter: Payer: Self-pay | Admitting: Physical Therapy

## 2017-06-03 DIAGNOSIS — R252 Cramp and spasm: Secondary | ICD-10-CM

## 2017-06-03 DIAGNOSIS — M6281 Muscle weakness (generalized): Secondary | ICD-10-CM | POA: Diagnosis not present

## 2017-06-03 NOTE — Therapy (Signed)
[facility] [address], [place], [place], [address] Phone: [phone]   Fax:  [phone]  Physical Therapy Treatment  Patient Details  Name: Sherri Olsen MRN: [id number] Date of Birth: 08-Dec-1945 Referring Provider: Dr. Renaldo Fiddler  Encounter Date: 06/03/2017      PT End of Session - 06/03/17 1505    Visit Number 3   Number of Visits 10   Date for PT Re-Evaluation 06/23/17   Authorization Type Medicare g-code 10th visit; kx modifier on 15th visit   PT Start Time 1417  pt arrived late   PT Stop Time 1447   PT Time Calculation (min) 30 min   Activity Tolerance Patient tolerated treatment well   Behavior During Therapy [facility] for tasks assessed/performed      Past Medical History:  Diagnosis Date  . Anemia   . Anxiety   . Anxiety and depression   . Colon polyps    adenomatous  . Constipation   . GERD (gastroesophageal reflux disease)   . Headache   . IBS (irritable bowel syndrome)   . Melanosis coli   . Osteoporosis   . Ulcerative colitis ([place])    ?    Past Surgical History:  Procedure Laterality Date  . ABDOMINAL HYSTERECTOMY    . APPENDECTOMY    . CHOLECYSTECTOMY  2016  . COLONOSCOPY W/ POLYPECTOMY    . herniated disk    . HIP ARTHROSCOPY Right    debridement and ligament  . KYPHOPLASTY N/A 08/26/2016   Procedure: LUMBAR ONE KYPHOPLASTY;  Surgeon: Newman Pies, MD;  Location: [facility];  Service: Neurosurgery;  Laterality: N/A;  KYPHOPLASTY L1  . SACROILIAC JOINT FUSION Right 1987   Hip  . TONSILLECTOMY      There were no vitals filed for this visit.      Subjective Assessment - 06/03/17 1419    Subjective Pt states she will have constipation and is taking stimulant laxitives and then has diarrhea.  She was in the ED due to diarrhea the other day   Pertinent History NSAID induced pancreatitis   Patient Stated Goals improve muscle control   Currently in Pain? Yes   Pain Score  3    Pain Location Abdomen   Pain Orientation Upper;Mid   Pain Type Chronic pain   Pain Onset More than a month ago   Aggravating Factors  const or diarrea   Pain Relieving Factors medication   Multiple Pain Sites No                         [facility] Adult PT Treatment/Exercise - 06/03/17 0001      Self-Care   Self-Care Other Self-Care Comments   Other Self-Care Comments  toilet techniques educated and practiced     Neuro Re-ed    Neuro Re-ed Details  breathing with toileting techniques and working on engaging core and pelvic floor muscles     Manual Therapy   Myofascial Release abdominal fascia release to thoracic diaphraghm                PT Education - 06/03/17 1504    Education provided Yes   Education Details toilet techniques   Person(s) Educated Patient   Methods Explanation;Demonstration;Handout   Comprehension Verbalized understanding;Returned demonstration          PT Short Term Goals - 06/03/17 1722      PT SHORT TERM GOAL #1   Title independent  with abdominal massage to reduce pain and improve toileting with constipation   Baseline doing abdominal massage but is not sure it is helping   Time 4   Period Weeks   Status On-going     PT SHORT TERM GOAL #2   Title understand how to contract the pelvic floor muscles with pelvic floor EMG to strengthen the anal sphinter   Time 4   Period Weeks   Status On-going     PT SHORT TERM GOAL #3   Title understand foods that may affect her irritable bowel syndrome so she is able to manage the constipation and diarrhea    Baseline discussed management with mirilax   Time 4   Period Weeks   Status On-going           PT Long Term Goals - 04/28/17 2121      PT LONG TERM GOAL #1   Title independent with HEP to strengthen the anal sphincter muscles   Time 8   Period Weeks   Status New     PT LONG TERM GOAL #2   Title abdominal pain decreased so she is able to be 50% more social with  friends   Time 8   Period Weeks   Status New     PT LONG TERM GOAL #3   Title bilateral hip strength >/= 4/5 to assist in pelvic floor strength   Time 8   Period Weeks   Status New     PT LONG TERM GOAL #4   Title FOTO score is </= 36% limitation               Plan - 06/03/17 1508    Clinical Impression Statement Patient has still been constipated with bouts of diarrhea when she has to take laxitive.  Pt educated on trying to slowly introduce more fiber but not so quickly that it irritates her bowels.  Pt arrived 17 minutes late today so did not have time for EMG.  Able to do more education on abdominal barcing and pelvic floor contraction with tactile feedback.   Rehab Potential Excellent   Clinical Impairments Affecting Rehab Potential osteoporosis; right SI fusion   PT Treatment/Interventions Moist Heat;Biofeedback;Cryotherapy;Ultrasound;Therapeutic activities;Therapeutic exercise;Neuromuscular re-education;Patient/family education;Scar mobilization;Manual techniques;Dry needling;Energy conservation   PT Next Visit Plan EMG, pelvic floor and core strength   PT Home Exercise Plan progress as needed   Consulted and Agree with Plan of Care Patient      Patient will benefit from skilled therapeutic intervention in order to improve the following deficits and impairments:  Decreased coordination, Increased fascial restricitons, Decreased endurance, Decreased activity tolerance, Increased muscle spasms, Pain, Decreased strength, Decreased mobility  Visit Diagnosis: Muscle weakness (generalized)  Cramp and spasm     Problem List Patient Active Problem List   Diagnosis Date Noted  . Lumbar compression fracture, with delayed healing, subsequent encounter 08/26/2016    Zannie Cove, PT 06/03/2017, 5:27 PM  [facility] [address], [place], [place], [address] Phone: [phone]   Fax:  [phone]  Name:  Sherri Olsen MRN: [id number] Date of Birth: 10/25/1946

## 2017-06-03 NOTE — Patient Instructions (Signed)
Toileting Techniques for Bowel Movements (Defecation) Using your belly (abdomen) and pelvic floor muscles to have a bowel movement is usually instinctive.  Sometimes people can have problems with these muscles and have to relearn proper defecation (emptying) techniques.  If you have weakness in your muscles, organs that are falling out, decreased sensation in your pelvis, or ignore your urge to go, you may find yourself straining to have a bowel movement.  You are straining if you are: . holding your breath or taking in a huge gulp of air and holding it  . keeping your lips and jaw tensed and closed tightly . turning red in the face because of excessive pushing or forcing . developing or worsening your  hemorrhoids . getting faint while pushing . not emptying completely and have to defecate many times a day  If you are straining, you are actually making it harder for yourself to have a bowel movement.  Many people find they are pulling up with the pelvic floor muscles and closing off instead of opening the anus. Due to lack pelvic floor relaxation and coordination the abdominal muscles, one has to work harder to push the feces out.  Many people have never been taught how to defecate efficiently and effectively.  Notice what happens to your body when you are having a bowel movement.  While you are sitting on the toilet pay attention to the following areas: . Jaw and mouth position . Angle of your hips   . Whether your feet touch the ground or not . Arm placement  . Spine position . Waist . Belly tension . Anus (opening of the anal canal)  An Evacuation/Defecation Plan   Here are the 4 basic points:  1. Lean forward enough for your elbows to rest on your knees 2. Support your feet on the floor or use a low stool if your feet don't touch the floor  3. Push out your belly as if you have swallowed a beach ball-you should feel a widening of your waist 4. Open and relax your pelvic floor muscles,  rather than tightening around the anus      The following conditions my require modifications to your toileting posture:  . If you have had surgery in the past that limits your back, hip, pelvic, knee or ankle flexibility . Constipation   Your healthcare practitioner may make the following additional suggestions and adjustments:  1) Sit on the toilet  a) Make sure your feet are supported. b) Notice your hip angle and spine position-most people find it effective to lean forward or raise their knees, which can help the muscles around the anus to relax  c) When you lean forward, place your forearms on your thighs for support  2) Relax suggestions a) Breath deeply in through your nose and out slowly through your mouth as if you are smelling the flowers and blowing out the candles. b) To become aware of how to relax your muscles, contracting and releasing muscles can be helpful.  Pull your pelvic floor muscles in tightly by using the image of holding back gas, or closing around the anus (visualize making a circle smaller) and lifting the anus up and in.  Then release the muscles and your anus should drop down and feel open. Repeat 5 times ending with the feeling of relaxation. c) Keep your pelvic floor muscles relaxed; let your belly bulge out. d) The digestive tract starts at the mouth and ends at the anal opening, so be   sure to relax both ends of the tube.  Place your tongue on the roof of your mouth with your teeth separated.  This helps relax your mouth and will help to relax the anus at the same time.  3) Empty (defecation) a) Keep your pelvic floor and sphincter relaxed, then bulge your anal muscles.  Make the anal opening wide.  b) Stick your belly out as if you have swallowed a beach ball. c) Make your belly wall hard using your belly muscles while continuing to breathe. Doing this makes it easier to open your anus. d) Breath out and give a grunt (or try using other sounds such as  ahhhh, shhhhh, ohhhh or grrrrrrr).  4) Finish a) As you finish your bowel movement, pull the pelvic floor muscles up and in.  This will leave your anus in the proper place rather than remaining pushed out and down. If you leave your anus pushed out and down, it will start to feel as though that is normal and give you incorrect signals about needing to have a bowel movement.   [facility] [address] [place], [place] [address]  

## 2017-06-14 ENCOUNTER — Ambulatory Visit: Payer: Medicare Other | Attending: Gastroenterology | Admitting: Physical Therapy

## 2017-06-14 ENCOUNTER — Encounter: Payer: Self-pay | Admitting: Physical Therapy

## 2017-06-14 DIAGNOSIS — R252 Cramp and spasm: Secondary | ICD-10-CM | POA: Diagnosis present

## 2017-06-14 DIAGNOSIS — M6281 Muscle weakness (generalized): Secondary | ICD-10-CM

## 2017-06-14 NOTE — Therapy (Signed)
[facility] [address] [place], [place], [address] Phone: [phone]   Fax:  [phone]  Physical Therapy Treatment  Patient Details  Name: Sherri Olsen MRN: [id number] Date of Birth: October 16, 1946 Referring Provider: Dr. Renaldo Fiddler  Encounter Date: 06/14/2017      PT End of Session - 06/14/17 1056    Visit Number 4   Number of Visits 10   Date for PT Re-Evaluation 06/23/17   Authorization Type Medicare g-code 10th visit; kx modifier on 15th visit   PT Start Time 1015   PT Stop Time 1056   PT Time Calculation (min) 41 min   Activity Tolerance Patient tolerated treatment well   Behavior During Therapy [facility] for tasks assessed/performed      Past Medical History:  Diagnosis Date  . Anemia   . Anxiety   . Anxiety and depression   . Colon polyps    adenomatous  . Constipation   . GERD (gastroesophageal reflux disease)   . Headache   . IBS (irritable bowel syndrome)   . Melanosis coli   . Osteoporosis   . Ulcerative colitis ([place])    ?    Past Surgical History:  Procedure Laterality Date  . ABDOMINAL HYSTERECTOMY    . APPENDECTOMY    . CHOLECYSTECTOMY  2016  . COLONOSCOPY W/ POLYPECTOMY    . herniated disk    . HIP ARTHROSCOPY Right    debridement and ligament  . KYPHOPLASTY N/A 08/26/2016   Procedure: LUMBAR ONE KYPHOPLASTY;  Surgeon: Newman Pies, MD;  Location: [facility];  Service: Neurosurgery;  Laterality: N/A;  KYPHOPLASTY L1  . SACROILIAC JOINT FUSION Right 1987   Hip  . TONSILLECTOMY      There were no vitals filed for this visit.      Subjective Assessment - 06/14/17 1025    Subjective I feel the same.  I have NSAIDS induced Pancreatitis.  I had to go to the hospital for the issues. I have not been taking the laxative.  I am not sure if I have emptied completely.    Pertinent History NSAID induced pancreatitis   Patient Stated Goals improve muscle control   Currently in  Pain? Yes   Pain Score 3    Pain Location Abdomen   Pain Orientation Upper;Mid   Pain Descriptors / Indicators Dull   Pain Type Chronic pain   Pain Onset More than a month ago   Pain Frequency Intermittent   Aggravating Factors  constant diarrhea   Pain Relieving Factors medication            [facility] PT Assessment - 06/14/17 0001      Assessment   Medical Diagnosis K58.0 Irritable bowel syndrome with diarrhea; K59.00 Constipation, unspecified constipation   Prior Therapy None     Precautions   Precautions Other (comment)   Precaution Comments osteoporosis     Restrictions   Weight Bearing Restrictions No     Balance Screen   Has the patient fallen in the past 6 months No   Has the patient had a decrease in activity level because of a fear of falling?  No   Is the patient reluctant to leave their home because of a fear of falling?  No     Home Ecologist residence     Prior Function   Level of Independence Independent   Vocation Retired   Leisure read, walks  Cognition   Overall Cognitive Status Within Functional Limits for tasks assessed     Observation/Other Assessments   Focus on Therapeutic Outcomes (FOTO)  45% limitation Bowel survey  goal is 36% limitation     Posture/Postural Control   Posture/Postural Control No significant limitations     AROM   Lumbar Extension decreased by 50%   Lumbar - Right Side Bend decreased by 25%     Strength   Right Hip Extension 2+/5   Right Hip ABduction 3/5   Left Hip Extension 2+/5   Left Hip ABduction 3/5     Palpation   SI assessment  pelvis in correct alignment   Palpation comment --                  Pelvic Floor Special Questions - 06/14/17 0001    Exam Type Deferred                   PT Education - 06/14/17 1054    Education provided Yes   Education Details reviewed HEP and added hip strength with pelvic floor strength   Person(s) Educated Patient    Methods Explanation;Demonstration;Handout   Comprehension Verbalized understanding;Returned demonstration          PT Short Term Goals - 06/14/17 1028      PT SHORT TERM GOAL #1   Title independent with abdominal massage to reduce pain and improve toileting with constipation   Baseline doing abdominal massage but is not sure it is helping   Time 4   Period Weeks   Status Achieved     PT SHORT TERM GOAL #2   Title understand how to contract the pelvic floor muscles with pelvic floor EMG to strengthen the anal sphinter   Time 4   Period Weeks   Status Not Met     PT SHORT TERM GOAL #3   Title understand foods that may affect her irritable bowel syndrome so she is able to manage the constipation and diarrhea    Baseline discussed management with mirilax   Time 4   Period Weeks   Status Achieved           PT Long Term Goals - 06/14/17 1151      PT LONG TERM GOAL #1   Title independent with HEP to strengthen the anal sphincter muscles   Time 8   Period Weeks   Status Achieved     PT LONG TERM GOAL #2   Title abdominal pain decreased so she is able to be 50% more social with friends   Time 8   Period Weeks   Status Not Met     PT LONG TERM GOAL #3   Title bilateral hip strength >/= 4/5 to assist in pelvic floor strength   Time 8   Period Weeks   Status Not Met     PT LONG TERM GOAL #4   Title FOTO score is </= 36% limitation   Time 8   Period Weeks   Status Not Met               Plan - 06/14/17 1056    Clinical Impression Statement Patient has not met her LTG except for understanding her HEP.  Patient continues to have weakness in her hips.  Patient continues to have difficulty with constipation and diahrrea.  Patient understands how to contract her abdominals and pelvic floor correctly. Patient will be discharged to a home program.  Rehab Potential Excellent   Clinical Impairments Affecting Rehab Potential osteoporosis; right SI fusion   PT  Treatment/Interventions Moist Heat;Biofeedback;Cryotherapy;Ultrasound;Therapeutic activities;Therapeutic exercise;Neuromuscular re-education;Patient/family education;Scar mobilization;Manual techniques;Dry needling;Energy conservation   PT Next Visit Plan Discharge to HEP   PT Home Exercise Plan Current HEP   Recommended Other Services sent second repuest to MD to sign initial cert   Consulted and Agree with Plan of Care Patient      Patient will benefit from skilled therapeutic intervention in order to improve the following deficits and impairments:  Decreased coordination, Increased fascial restricitons, Decreased endurance, Decreased activity tolerance, Increased muscle spasms, Pain, Decreased strength, Decreased mobility  Visit Diagnosis: Muscle weakness (generalized)  Cramp and spasm       G-Codes - 07-08-17 1151    Functional Assessment Tool Used (Outpatient Only) FOTO score is 45% limitation   Functional Limitation Other PT primary   Other PT Primary Goal Status (G5364) At least 20 percent but less than 40 percent impaired, limited or restricted   Other PT Primary Discharge Status (W8032) At least 40 percent but less than 60 percent impaired, limited or restricted      Problem List Patient Active Problem List   Diagnosis Date Noted  . Lumbar compression fracture, with delayed healing, subsequent encounter 08/26/2016    Earlie Counts, PT 07/08/2017 11:54 AM   [facility] [address] [place], [place], [address] Phone: [phone]   Fax:  [phone]  Name: Sherri Olsen MRN: [id number] Date of Birth: May 27, 1946  PHYSICAL THERAPY DISCHARGE SUMMARY  Visits from Start of Care: 4  Current functional level related to goals / functional outcomes: See above. Patient has not seen changes at this time.    Remaining deficits: See above.    Education / Equipment: HEP Plan: Patient agrees to discharge.   Patient goals were not met. Patient is being discharged due to lack of progress. Thank you for the referral. Earlie Counts, PT 07-08-2017 11:54 AM  ?????

## 2017-06-14 NOTE — Patient Instructions (Addendum)
Balloon Breath    Place hands LIGHTLY on belly below navel. Imagine a balloon inside belly. Blow up balloon on breath IN, deflate balloon on breath OUT. Contract abdominals slightly to assist breath OUT.  1 min 2x/day   Copyright  VHI. All rights reserved.   Toileting Techniques for Bowel Movements (Defecation)  Using your belly (abdomen) and pelvic floor muscles to have a bowel movement is usually instinctive.  Sometimes people can have problems with these muscles and have to relearn proper defecation (emptying) techniques.  If you have weakness in your muscles, organs that are falling out, decreased sensation in your pelvis, or ignore your urge to go, you may find yourself straining to have a bowel movement.  You are straining if you are:   holding your breath or taking in a huge gulp of air and holding it   keeping your lips and jaw tensed and closed tightly  turning red in the face because of excessive pushing or forcing  developing or worsening your  hemorrhoids  getting faint while pushing  not emptying completely and have to defecate many times a day  If you are straining, you are actually making it harder for yourself to have a bowel movement.  Many people find they are pulling up with the pelvic floor muscles and closing off instead of opening the anus. Due to lack pelvic floor relaxation and coordination the abdominal muscles, one has to work harder to push the feces out.  Many people have never been taught how to defecate efficiently and effectively.  Notice what happens to your body when you are having a bowel movement.  While you are sitting on the toilet pay attention to the following areas:  Jaw and mouth position  Angle of your hips    Whether your feet touch the ground or not  Arm placement   Spine position  Waist  Belly tension  Anus (opening of the anal canal)  An Evacuation/Defecation Plan   Here are the 4 basic points:  1. Lean forward  enough for your elbows to rest on your knees 2. Support your feet on the floor or use a low stool if your feet don't touch the floor  3. Push out your belly as if you have swallowed a beach ball-you should feel a widening of your waist 4. Open and relax your pelvic floor muscles, rather than tightening around the anus      The following conditions my require modifications to your toileting posture:   If you have had surgery in the past that limits your back, hip, pelvic, knee or ankle flexibility  Constipation   Your healthcare practitioner may make the following additional suggestions and adjustments:  1. Sit on the toilet  a)   Make sure your feet are supported. b)   Notice your hip angle and spine position-most people find it effective to lean forward or raise their knees, which can help the muscles around the anus to relax  c)   When you lean forward, place your forearms on your thighs for support  2. Relax suggestions a)   Breath deeply in through your nose and out slowly through your mouth as if you are smelling the flowers and blowing out the candles. b)   To become aware of how to relax your muscles, contracting and releasing muscles can be helpful.  Pull your pelvic floor muscles in tightly by using the image of holding back gas, or closing around the anus (visualize making  a circle smaller) and lifting the anus up and in.  Then release the muscles and your anus should drop down and feel open. Repeat 5 times ending with the feeling of relaxation. c)   Keep your pelvic floor muscles relaxed; let your belly bulge out. d)   The digestive tract starts at the mouth and ends at the anal opening, so be sure torelax both ends of the tube.  Place your tongue on the roof of your mouth withyour teeth separated.  This helps relax your mouth and will help to relax the anus at the same time.  3. Empty (defecation) a)   Keep your pelvic floor and sphincter relaxed, then bulge your  anal muscles.  Make the anal opening wide.  b)   Stick your belly out as if you have swallowed a beach ball. c)   Make your belly wall hard using your belly muscles while continuing to breathe. Doing this makes it easier to open your anus. d)   Breath out and give a grunt (or try using other sounds such as ahhhh, shhhhh, ohhhh or grrrrrrr).  4)   Finish a)   As you finish your bowel movement, pull the pelvic floor muscles up and in.  This will leave your anus in the proper place rather than remaining pushed out and down. If you leave your anus pushed out and down, it will start to feel as though that is normal and give you incorrect signals about needing to   About Abdominal Massage  Abdominal massage, also called external colon massage, is a self-treatment circular massage technique that can reduce and eliminate gas and ease constipation. The colon naturally contracts in waves in a clockwise direction starting from inside the right hip, moving up toward the ribs, across the belly, and down inside the left hip.  When you perform circular abdominal massage, you help stimulate your colon's normal wave pattern of movement called peristalsis.  It is most beneficial when done after eating.  Positioning You can practice abdominal massage with oil while lying down, or in the shower with soap.  Some people find that it is just as effective to do the massage through clothing while sitting or standing.  How to Massage Start by placing your finger tips or knuckles on your right side, just inside your hip bone.   Make small circular movements while you move upward toward your rib cage.    Once you reach the bottom right side of your rib cage, take your circular movements across to the left side of the bottom of your rib cage.   Next, move downward until you reach the inside of your left hip bone.  This is the path your feces travel in your colon.  Continue to perform your abdominal massage in this  pattern for 10 minutes each day.     You can apply as much pressure as is comfortable in your massage.  Start gently and build pressure as you continue to practice.  Notice any areas of pain as you massage; areas of slight pain may be relieved as you massage, but if you have areas of significant or intense pain, consult with your healthcare provider.  Other Considerations  General physical activity including bending and stretching can have a beneficial massage-like effect on the colon.  Deep breathing can also stimulate the colon because breathing deeply activates the same nervous system that supplies the colon.    Abdominal massage should always be used in combination with  a bowel-conscious diet that is high in the proper type of fiber for you, fluids (primarily water), and a regular exercise program.  Detox Tea for Teatox & Weight Loss to get a Skinny Tummy  Colon Cleanse by Teami Blends  Best to Raise 100% Natural Energy & Boost Metabolism  Reduce Bloating and Constipation.    Can get at [facility]  Strengthening: Hip Abduction (Side-Lying)    Tighten muscles on front of left thigh, then lift leg __6__ inches from surface, keeping knee locked.  Repeat _10___ times per set. Do __1__ sets per session. Do _1___ sessions per day.  http://orth.exer.us/622   Copyright  VHI. All rights reserved.  Bracing With Bridging (Hook-Lying)    With neutral spine, tighten pelvic floor and abdominals and hold. Lift bottom. Repeat 10___ times. Do __1_ times a day.   Copyright  VHI. All rights reserved.  Isometric Hold (Hook-Lying)    Lie with hips and knees bent. Slowly inhale, and then exhale. Pull navel toward spine and Hold for _5__ seconds. Continue to breathe in and out during hold. Rest for _5__ seconds. Repeat _10__ times. Do ___ times a day.   Copyright  VHI. All rights reserved.  Gluteal Sets    Squeeze pelvic floor and hold. Tighten bottom. Hold for _5__ seconds. Relax for  _5__ seconds. Repeat _10__ times. Do _1__ times a day.   Copyright  VHI. All rights reserved.  [facility] [address], [place], [place] [address] Phone # [phone] Fax [phone]

## 2017-06-28 ENCOUNTER — Encounter: Payer: Medicare Other | Admitting: Physical Therapy

## 2017-07-21 ENCOUNTER — Ambulatory Visit: Payer: Medicare Other | Admitting: Physical Therapy

## 2017-08-05 ENCOUNTER — Telehealth: Payer: Self-pay | Admitting: Internal Medicine

## 2017-08-05 NOTE — Telephone Encounter (Signed)
Patient states she would like Dr.Perry to refer her to the endocrinology office due to her hypothyroidism.

## 2017-08-05 NOTE — Telephone Encounter (Signed)
Spoke with pt and let her know her PCP should make the referral.

## 2021-05-15 ENCOUNTER — Encounter: Payer: Self-pay | Admitting: Internal Medicine

## 2021-07-24 ENCOUNTER — Encounter: Payer: Self-pay | Admitting: Physician Assistant

## 2021-07-24 ENCOUNTER — Ambulatory Visit (INDEPENDENT_AMBULATORY_CARE_PROVIDER_SITE_OTHER): Payer: Medicare Other | Admitting: Physician Assistant

## 2021-07-24 VITALS — BP 144/92 | HR 83 | Ht 60.0 in | Wt 144.0 lb

## 2021-07-24 DIAGNOSIS — R194 Change in bowel habit: Secondary | ICD-10-CM | POA: Diagnosis not present

## 2021-07-24 DIAGNOSIS — R159 Full incontinence of feces: Secondary | ICD-10-CM | POA: Diagnosis not present

## 2021-07-24 NOTE — Progress Notes (Addendum)
Chief Complaint: "Loose stools"  HPI:    Sherri Olsen is a 75 year old Caucasian female with a past medical history of anxiety, reflux, adenomatous colon polyps, constipation, IBS and others listed below, previously known to Dr. Henrene Pastor but was then seen at [place], who was referred to me by Elyn Aquas, MD for a complaint of "loose stools".    04/30/2015 EGD for dyspepsia and reflux was normal.    04/30/2015 colonoscopy with pedunculated polyp at the cecum, sessile polyp in the ascending colon, melanosis coli throughout the entire colon and otherwise normal.  It was noted that chronic GI complaints were consistent with IBS.  Pathology showed tubular adenomas and repeat was recommended in 3 years.    It looks like patient then followed with the [facility] in [place].  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    03/15/2017 office visit note from [facility] discussed that she had a sits marker study but they did not have results.  She completed anorectal manometry which demonstrated weak IAS and EAS pressures.  Also resting and squeeze studies revealed anal sphincter asymmetry.  Recommended she have physical therapy.  Apparently had a positive breath test and was treated with a course of rifaximin with little improvement.  At that time was having trouble with diarrhea and wanted to have a colostomy.  Was noted that she likely had fecal incontinence and constipated herself until she developed overflow.  Was recommended she have a trial physical therapy.  If this did not work then would recommend referral to urogynecology to discuss a sacral nerve stimulator.    05/11/2017 colonoscopy with [facility] with poor preparation of the colon, stool throughout the colon.  Was recommended she have repeat with better prep.    06/06/2018 last office visit note from [facility] noted lifelong history of constipation which alternated with diarrhea and crampy abdominal pain fecal incontinence.  She had seen urogynecologist and  refuses sacral nerve stimulator.  She was told that Lomotil would not be prescribed for her given that it was a controlled substance for chronic watery diarrhea and that was not the solution to her constipation with overflow.  Was noted she was high risk for overdose given her multiple scripts for controlled substances.  Also noted she had chronic abdominal pain syndrome.  It was noted she might do better with duloxetine 30 mg daily or desipramine 25 mg p.o. nightly.  This was deferred to her primary care physician.    Today, the patient presents to clinic and tells me that ever since she had her gallbladder out about 3 to 4 years ago she has had trouble with "thick/mucousy caramel colored stool" which just constantly comes out from her bottom which she cannot control.  Due to this has to wear Depends diapers and changes them 6-7 times a day.  Because of all this she feels like a "shut in", because she is worried about going out and people "smelling it".  Occasionally she will take Imodium but this does not help at all.  Tells me that she did not want to go through a stimulator placement and never really followed through with physical therapy.  Tells me the only thing that helped in the past was Lomotil which she was prescribed here prior to being seen at [facility] (see notes above, they would not represcribe for her).    Patient does describe very recent hospitalization in [place] for "the flu", apparently she was coughing up terrible smelling  mucus and was put on antibiotics and was at the hospital for a couple of weeks.    Denies fever, chills, weight loss, blood in her stool, heartburn, reflux or symptoms that awaken her from sleep.  Past Medical History:  Diagnosis Date   Anemia    Anxiety    Anxiety and depression    Colon polyps    adenomatous   Constipation    GERD (gastroesophageal reflux disease)    Headache    IBS (irritable bowel syndrome)    Melanosis coli    Osteoporosis    Ulcerative  colitis ([place])    ?    Past Surgical History:  Procedure Laterality Date   ABDOMINAL HYSTERECTOMY     APPENDECTOMY     CHOLECYSTECTOMY  2016   COLONOSCOPY W/ POLYPECTOMY     herniated disk     HIP ARTHROSCOPY Right    debridement and ligament   KYPHOPLASTY N/A 08/26/2016   Procedure: LUMBAR ONE KYPHOPLASTY;  Surgeon: Newman Pies, MD;  Location: [facility];  Service: Neurosurgery;  Laterality: N/A;  KYPHOPLASTY L1   SACROILIAC JOINT FUSION Right 1987   Hip   TONSILLECTOMY      Current Outpatient Medications  Medication Sig Dispense Refill   bisacodyl (DULCOLAX) 5 MG EC tablet Take 10 mg by mouth daily as needed for moderate constipation.     clonazePAM (KLONOPIN) 1 MG tablet Take 1 mg by mouth 3 (three) times daily.      diphenoxylate-atropine (LOMOTIL) 2.5-0.025 MG tablet TAKE ONE TABLET BY MOUTH AS NEEDED FOR DIARRHEA OR  LOOSE  STOOLS  **  MAY  FILL  EVERY  OTHER  MONTH  ** 20 tablet 0   fluticasone (FLONASE) 50 MCG/ACT nasal spray Place into both nostrils daily.     ibuprofen (ADVIL,MOTRIN) 200 MG tablet Take 200 mg by mouth every 6 (six) hours as needed.     loperamide (IMODIUM) 2 MG capsule Take 1 capsule (2 mg total) by mouth as needed for diarrhea or loose stools. 30 capsule 0   loperamide (IMODIUM) 2 MG capsule Take 1 capsule (2 mg total) by mouth as needed for diarrhea or loose stools. (Patient not taking: Reported on 04/28/2017) 30 capsule 0   omeprazole (PRILOSEC) 40 MG capsule Take 40 mg by mouth 2 (two) times daily.      ondansetron (ZOFRAN) 4 MG tablet Take 4 mg by mouth every 8 (eight) hours as needed for nausea or vomiting.     oxyCODONE-acetaminophen (PERCOCET/ROXICET) 5-325 MG tablet Take 1-2 tablets by mouth every 4 (four) hours as needed for moderate pain. (Patient not taking: Reported on 04/28/2017) 50 tablet 0   ranitidine (ZANTAC) 150 MG tablet Take 150 mg by mouth daily as needed for heartburn.      zolpidem (AMBIEN) 10 MG tablet Take 10 mg by mouth at bedtime as  needed for sleep.     No current facility-administered medications for this visit.    Allergies as of 07/24/2021 - Review Complete 06/14/2017  Allergen Reaction Noted   No known allergies  08/25/2016    Family History  Problem Relation Age of Onset   Colon cancer Father 78   Colon cancer Maternal Grandmother    Ulcerative colitis Maternal Uncle    Diabetes Maternal Uncle    Irritable bowel syndrome Maternal Uncle    Diverticulitis Mother     Social History   Socioeconomic History   Marital status: Married    Spouse name: Not on file  Number of children: 1   Years of education: Not on file   Highest education level: Not on file  Occupational History   Occupation: Retired Therapist, sports  Tobacco Use   Smoking status: Never   Smokeless tobacco: Never  Substance and Sexual Activity   Alcohol use: No    Alcohol/week: 0.0 standard drinks   Drug use: No   Sexual activity: Not on file  Other Topics Concern   Not on file  Social History Narrative   Not on file   Social Determinants of Health   Financial Resource Strain: Not on file  Food Insecurity: Not on file  Transportation Needs: Not on file  Physical Activity: Not on file  Stress: Not on file  Social Connections: Not on file  Intimate Partner Violence: Not on file    Review of Systems:    Constitutional: No weight loss, fever or chills Skin: No rash  Cardiovascular: No chest pain   Respiratory: +SOB Gastrointestinal: See HPI and otherwise negative Genitourinary: No dysuria  Neurological: No headache, dizziness or syncope Musculoskeletal: No new muscle or joint pain Hematologic: No bleeding or bruising Psychiatric: +anxiety   Physical Exam:  Vital signs: BP (!) 144/92   Pulse 83   Ht 5' (1.524 m)   Wt 144 lb (65.3 kg)   SpO2 93%   BMI 28.12 kg/m    Constitutional:   Pleasant elderly Caucasian female appears to be in NAD, Well developed, Well nourished, alert and cooperative Head:  Normocephalic and  atraumatic. Eyes:   PEERL, EOMI. No icterus. Conjunctiva pink. Ears:  Normal auditory acuity. Neck:  Supple Throat: Oral cavity and pharynx without inflammation, swelling or lesion.  Respiratory: Respirations even and unlabored. Lungs clear to auscultation bilaterally.   No wheezes, crackles, or rhonchi.  Cardiovascular: Normal S1, S2. No MRG. Regular rate and rhythm. No peripheral edema, cyanosis or pallor.  Gastrointestinal:  Soft, nondistended, nontender. No rebound or guarding. Normal bowel sounds. No appreciable masses or hepatomegaly. Rectal:  Not performed.  Msk:  Symmetrical without gross deformities. Without edema, no deformity or joint abnormality.  Neurologic:  Alert and  oriented x4;  grossly normal neurologically.  Skin:   Dry and intact without significant lesions or rashes. Psychiatric:  Demonstrates good judgement and reason without abnormal affect or behaviors.  No recent labs or imaging.  Assessment: 1.  Fecal incontinence: Full evaluation at [place] back in 2018 with anal manometry showing weakened sphincter, recommendations for possible nerve stimulator placement and/or PT, but patient did not want to follow through with this, it was thought her constant stool was from constipation/overflow and not from true diarrhea, they did not want to fill Lomotil as she is already on Oxycodone and did not feel like it would help her symptoms; likely the same because now 2.  Change in bowel habits: Over the past 3 to 4 years a change towards incontinence  Plan: 1.  Patient does need to have her colonoscopy updated.  Given that she was just recently in the hospital with breathing difficulties and still feels short of breath today with audible wheezing I do not think we should pursue an expedited colonoscopy.  Discussed this with the patient today.  I would like to get her records from [facility] to figure out what her true diagnosis was and what imaging was done.  If her lungs are  improved then we can discuss a colonoscopy in the next 2 to 3 months. 2.  Explained that once her colonoscopy  is updated we can see if she would benefit from any further testing, though likely her issues with incontinence are still related to poor sphincter tone. 3.  [facility] was hesitant to give her Lomotil and I will not prescribe today to start, but if this is the only thing that helps her it may be an option in the future pending colonoscopy. 4.  Patient was assigned Dr. Silverio Decamp this morning as she did leave Dr. Blanch Media care and is no longer considered his patient.  Patient will follow in clinic with me for recommendations after review of records above.  Sherri Newer, PA-C [facility] 07/24/2021, 9:00 AM  Cc: Elyn Aquas, MD   Addendum: 08/29/2021 11:11 AM  Received records from the emergency department in [place].  08/21/2021 patient arrived after being brought in by the EMS, apparently found by her husband unresponsive on the floor in the room.  Patient was intubated.  She then needed CPR due to PEA and was declared dead after they were unable to resuscitate.  We will change her records in our system.  Sherri Newer, PA-C

## 2021-07-24 NOTE — Patient Instructions (Signed)
Will call back to schedule colonoscopy once we review records from hospitalization.   If you are age 73 or older, your body mass index should be between 23-30. Your Body mass index is 28.12 kg/m. If this is out of the aforementioned range listed, please consider follow up with your Primary Care Provider.  If you are age 21 or younger, your body mass index should be between 19-25. Your Body mass index is 28.12 kg/m. If this is out of the aformentioned range listed, please consider follow up with your Primary Care Provider.   __________________________________________________________  The [facility] providers would like to encourage you to use [facility] to communicate with providers for non-urgent requests or questions.  Due to long hold times on the telephone, sending your provider a message by [facility] may be a faster and more efficient way to get a response.  Please allow 48 business hours for a response.  Please remember that this is for non-urgent requests.

## 2021-08-01 ENCOUNTER — Encounter: Payer: Medicare Other | Admitting: Internal Medicine

## 2021-08-06 NOTE — Progress Notes (Signed)
Reviewed and agree with documentation and assessment and plan. K. Veena Dejuan Elman , MD   

## 2021-09-09 ENCOUNTER — Ambulatory Visit: Payer: Medicare Other | Admitting: Physician Assistant

## 2021-09-09 DEATH — deceased
